# Patient Record
Sex: Male | Born: 1955 | Race: White | Hispanic: No | Marital: Single | Smoking: Never smoker
Health system: Southern US, Community
[De-identification: ages and names within clinical notes are randomized; demographics above are authoritative.]

## PROBLEM LIST (undated history)

## (undated) DIAGNOSIS — M5137 Other intervertebral disc degeneration, lumbosacral region: Secondary | ICD-10-CM

## (undated) DIAGNOSIS — I499 Cardiac arrhythmia, unspecified: Secondary | ICD-10-CM

## (undated) DIAGNOSIS — Q2381 Bicuspid aortic valve: Secondary | ICD-10-CM

## (undated) DIAGNOSIS — Z87442 Personal history of urinary calculi: Secondary | ICD-10-CM

## (undated) DIAGNOSIS — T84022A Instability of internal right knee prosthesis, initial encounter: Secondary | ICD-10-CM

## (undated) DIAGNOSIS — N201 Calculus of ureter: Secondary | ICD-10-CM

## (undated) DIAGNOSIS — G4700 Insomnia, unspecified: Secondary | ICD-10-CM

## (undated) DIAGNOSIS — M51379 Other intervertebral disc degeneration, lumbosacral region without mention of lumbar back pain or lower extremity pain: Secondary | ICD-10-CM

## (undated) DIAGNOSIS — G5791 Unspecified mononeuropathy of right lower limb: Secondary | ICD-10-CM

## (undated) DIAGNOSIS — K219 Gastro-esophageal reflux disease without esophagitis: Secondary | ICD-10-CM

## (undated) DIAGNOSIS — Q231 Congenital insufficiency of aortic valve: Secondary | ICD-10-CM

## (undated) DIAGNOSIS — M1990 Unspecified osteoarthritis, unspecified site: Secondary | ICD-10-CM

## (undated) DIAGNOSIS — I480 Paroxysmal atrial fibrillation: Secondary | ICD-10-CM

## (undated) DIAGNOSIS — Z85828 Personal history of other malignant neoplasm of skin: Secondary | ICD-10-CM

## (undated) DIAGNOSIS — G894 Chronic pain syndrome: Secondary | ICD-10-CM

## (undated) DIAGNOSIS — Z9889 Other specified postprocedural states: Secondary | ICD-10-CM

## (undated) DIAGNOSIS — Z9289 Personal history of other medical treatment: Secondary | ICD-10-CM

## (undated) DIAGNOSIS — D649 Anemia, unspecified: Secondary | ICD-10-CM

## (undated) DIAGNOSIS — R399 Unspecified symptoms and signs involving the genitourinary system: Secondary | ICD-10-CM

## (undated) HISTORY — PX: TOTAL KNEE ARTHROPLASTY: SHX125

## (undated) HISTORY — PX: JOINT REPLACEMENT: SHX530

## (undated) HISTORY — PX: KNEE ARTHROSCOPY: SUR90

## (undated) HISTORY — PX: TONSILLECTOMY: SUR1361

## (undated) HISTORY — PX: OTHER SURGICAL HISTORY: SHX169

## (undated) HISTORY — PX: SHOULDER ARTHROSCOPY: SHX128

## (undated) HISTORY — PX: REVISION TOTAL KNEE ARTHROPLASTY: SUR1280

## (undated) HISTORY — PX: ELBOW ARTHROSCOPY: SUR87

## (undated) HISTORY — PX: NASAL FRACTURE SURGERY: SHX718

---

## 2001-07-20 ENCOUNTER — Encounter: Payer: Self-pay | Admitting: Urology

## 2001-07-20 ENCOUNTER — Encounter: Admission: RE | Admit: 2001-07-20 | Discharge: 2001-07-20 | Payer: Self-pay | Admitting: Urology

## 2002-05-21 ENCOUNTER — Ambulatory Visit (HOSPITAL_BASED_OUTPATIENT_CLINIC_OR_DEPARTMENT_OTHER): Admission: RE | Admit: 2002-05-21 | Discharge: 2002-05-21 | Payer: Self-pay | Admitting: *Deleted

## 2003-01-17 ENCOUNTER — Ambulatory Visit (HOSPITAL_COMMUNITY): Admission: RE | Admit: 2003-01-17 | Discharge: 2003-01-17 | Payer: Self-pay | Admitting: Urology

## 2003-01-17 ENCOUNTER — Encounter: Payer: Self-pay | Admitting: Urology

## 2003-04-22 ENCOUNTER — Ambulatory Visit (HOSPITAL_BASED_OUTPATIENT_CLINIC_OR_DEPARTMENT_OTHER): Admission: RE | Admit: 2003-04-22 | Discharge: 2003-04-22 | Payer: Self-pay | Admitting: Urology

## 2009-02-23 HISTORY — PX: MOHS SURGERY: SUR867

## 2015-11-06 ENCOUNTER — Other Ambulatory Visit (HOSPITAL_COMMUNITY): Payer: Self-pay | Admitting: Orthopedic Surgery

## 2015-11-06 DIAGNOSIS — M25561 Pain in right knee: Secondary | ICD-10-CM

## 2015-11-06 DIAGNOSIS — T849XXA Unspecified complication of internal orthopedic prosthetic device, implant and graft, initial encounter: Secondary | ICD-10-CM

## 2015-11-06 DIAGNOSIS — Z96651 Presence of right artificial knee joint: Secondary | ICD-10-CM

## 2015-11-09 ENCOUNTER — Encounter (HOSPITAL_COMMUNITY)
Admission: RE | Admit: 2015-11-09 | Discharge: 2015-11-09 | Disposition: A | Discharge status: Home / Self Care | Payer: BLUE CROSS/BLUE SHIELD | Source: Ambulatory Visit | Attending: Orthopedic Surgery | Admitting: Orthopedic Surgery

## 2015-11-09 DIAGNOSIS — Y838 Other surgical procedures as the cause of abnormal reaction of the patient, or of later complication, without mention of misadventure at the time of the procedure: Secondary | ICD-10-CM | POA: Diagnosis not present

## 2015-11-09 DIAGNOSIS — M25561 Pain in right knee: Secondary | ICD-10-CM | POA: Diagnosis not present

## 2015-11-09 DIAGNOSIS — T849XXA Unspecified complication of internal orthopedic prosthetic device, implant and graft, initial encounter: Secondary | ICD-10-CM

## 2015-11-09 DIAGNOSIS — Z96651 Presence of right artificial knee joint: Secondary | ICD-10-CM

## 2015-11-09 MED ORDER — TECHNETIUM TC 99M MEDRONATE IV KIT
22.3000 | PACK | Freq: Once | INTRAVENOUS | Status: AC | PRN
  Administered 2015-11-09: 22.3 via INTRAVENOUS

## 2016-01-11 ENCOUNTER — Ambulatory Visit: Payer: Self-pay | Admitting: Orthopedic Surgery

## 2016-01-11 NOTE — H&P (Signed)
Jeremy Webb DOB: 12/24/1955 Single / Language: Jeremy Webb / Race: White Male Date of Admission:  01/24/2016 CC:  Right Knee Pain History of Present Illness The patient is a 60 year old male who comes in for a preoperative History and Physical. The patient is scheduled for a right total knee arthroplasty (revision) to be performed by Jeremy Webb. Aluisio, MD at [facility] on 01-24-2016. The patient is a 61 year old male who presented to the practice today for transition from another physician. The patient reports left knee symptoms including: pain, swelling and instability which began 1 year(s) (1 month) ago without any known injury. The patient describes the severity of the symptoms as moderate in severity. The patient describes their pain as sharp, dull, aching and throbbing.The patient feels that the symptoms are worsening. Prior to being seen today the patient was previously evaluated by a colleague. Previous work-up for this problem has included orthopedic consultation (RT TKA 09/25/2014). and include decreased range of motion, difficulty bearing weight and difficulty ambulating. The patient reports that symptoms radiate to the right thigh (IT band numbness). Onset of symptoms was gradual. Current treatment includes nonsteroidal anti-inflammatory drugs. Jeremy Webb comes in today referred from his PCP Jeremy Webb for second opinion with regards to teh right total knee that was performed last year, October 2015. He was originally diagnose with arthritis of the right knee and recommended to undergo a right total knee arthroplasty. The patient is a very active individual who hunts, fish, hikes, and enjoys the outdoors. He states that he was still doing okay but he just couldn't walk very well leading up to the knee replacement. The surgery was performed by Jeremy Webb at [facility] on 09/22/2014. Patient states that a drain was not used. The surgery went well and he was up walking the  hallway about 60-70 feet that afternoon and the PT had him bending 90 degrees. The knee sweelled a fair amount that night and into day one which at time is was aspirated. On POD 1, they wanted to send him home but he ended up statying until POD 2 due to being nauseated. He was still nauseated the following day, Saturday, but was released with medications. He went home Saturday and had increasing difficulty that night. He became more sick as the night went on and developed fevers of 101 to 102 by that night. His girlfriend called Jeremy Webb service and got in touch with him. He was told to go the next morning, Sunday to [facility] where he was seen by Jeremy Webb and admitted and underwent a second procedure (POD 3 form Right TKA) which was a formal open I&D and Poly exchange. He had a drain placed during this second procedure. Following the I&D, he did get some relief and improved initially for the first couple of months. The first HHPT had him bending 115 degrees and was driving by week three. Unfortunately, the knee got worse and by the third month, he had lost motion back to 90 degrees and had increasing pain and swelling. He ended up going to three differnet therapy departments without improvement. He never had any incisional issues or wound breakdown. At this time, he has diffiuclty picking up his leg. He is not able to do any of the things that he enjoys due to increasing pain and swelling. Its now hurting all the time. It feels like a boil inside the knee. He remembers having some blood work done at some point  at Jeremy Webb office but had not had any further testing, i.e. bone scans. In the past few months, he denies any fevers, chills, night sweatrs but does have constant pain with it. There is no buckling but it feels tight and swollen. The swelling has gone done since the original surgery but still present. He states now that his life has changed for the worse. He enjoys the outdoors, hunting,  fishing, hiking, and bike riding. He is unable to do these things now. 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 He has worked on [employer] for the local [place], [employer] and wants to get back to enjoying the outdoors. He even had to pass up on a safari trip next year with [employer] due to the ongoing problems with his knee. He continues with the significant pain in his right knee. He said every step is very painful for him. Knee also has been giving out on him. His lab work came back normal with normal white count, sed rate, C-reactive protein. He had a bone scan done, which was abnormal with increased uptake throughout. I believe he has a loose prosthesis. Clinically it is acting like that. Fortunately, the blood work was normal, so he has got a very low chance of this being an infection. It is felt that he will need this revised. They have been treated conservatively in the past for the above stated problem and despite conservative measures, they continue to have progressive pain and severe functional limitations and dysfunction. They have failed non-operative management including home exercise, medications, and injections. It is felt that they would benefit from undergoing revision of total joint replacement. Risks and benefits of the procedure have been discussed with the patient and they elect to proceed with surgery. There are no active contraindications to surgery such as ongoing infection or rapidly progressive neurological disease.  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 Tightness in throat  Family History Congestive Heart Failure  father Heart Disease  father and grandfather fathers side Hypertension  mother and father father and brother Osteoarthritis  grandmother fathers side Rheumatoid Arthritis  grandmother fathers side Cancer  grandfather mothers side  Social History Alcohol use  current drinker; drinks wine; only occasionally per week current drinker; drinks beer and wine; only occasionally per week Children  0 Current work status  retired Engineer, agricultural (Currently)  no Drug/Alcohol Rehab (Previously)  no Exercise  Exercises weekly; does individual sport and gym / [employer] Exercises daily; does gym / [employer] Illicit drug use  no Living situation  live alone Marital status  single Number of flights of stairs before winded  less than 1 greater than 5 Pain Contract  no Tobacco / smoke exposure  no Tobacco use  never smoker Previously in rehab  no Advance Directives  Living Will, Healthcare POA [place]  Medication History Hydrocodone-Acetaminophen (5-325MG  Tablet, Oral) Active. Cyclobenzaprine HCl (10MG  Tablet, Oral) Active. ALPRAZolam (0.25MG  Tablet, Oral) Active. CeleBREX (200MG  Capsule, Oral as needed) Active. Protonix (40MG  Tablet  DR, Oral as needed) Active.  Past Surgical History Arthroscopy of Knee  Date: 05/2013. right Colon Polyp Removal - Colonoscopy  Rotator Cuff Repair  bilateral Tonsillectomy  Arthroscopy of Shoulder  bilateral Total Knee Replacement - Right  Date: 10/2014.   Review of System General Not Present- Chills, Fatigue, Fever, Memory Loss, Night Sweats, Weight Gain and Weight Loss. Skin Not Present- Eczema, Hives, Itching, Lesions and Rash. HEENT Not Present- Dentures, Double Vision,  Headache, Hearing Loss, Tinnitus and Visual Loss. Respiratory Not Present- Allergies, Chronic Cough, Coughing up blood, Shortness of breath at rest and Shortness of breath with exertion. Cardiovascular Not Present- Chest Pain, Difficulty Breathing Lying Down, Murmur, Palpitations, Racing/skipping heartbeats and Swelling. Gastrointestinal Not Present- Abdominal Pain, Bloody Stool, Constipation, Diarrhea, Difficulty Swallowing, Heartburn, Jaundice, Loss of appetitie, Nausea and Vomiting. Male Genitourinary Not Present- Blood in Urine, Discharge, Flank Pain, Incontinence, Painful Urination, Urgency, Urinary frequency, Urinary Retention, Urinating at Night and Weak urinary stream. Musculoskeletal Present- Joint Pain. Not Present- Back Pain, Joint Swelling, Morning Stiffness, Muscle Pain, Muscle Weakness and Spasms. Neurological Not Present- Blackout spells, Difficulty with balance, Dizziness, Paralysis, Tremor and Weakness. Psychiatric Not Present- Insomnia.  Vitals  Weight: 250 lb Height: 72in Weight was reported by patient. Height was reported by patient. Body Surface Area: 2.34 m Body Mass Index: 33.91 kg/m  BP: 128/64 (Sitting, Left Arm, Standard)   Physical Exam General Mental Status -Alert, cooperative and good historian. General Appearance-pleasant, Not in acute distress. Orientation-Oriented X3. Build & Nutrition-Well nourished and Well developed.  Head and Neck Head-normocephalic, atraumatic . Neck Global Assessment - supple, no bruit auscultated on the right, no bruit auscultated on the left.  Eye Pupil - Bilateral-Regular and Round. Motion - Bilateral-EOMI.  Chest and Lung Exam Auscultation Breath sounds - clear at anterior chest wall and clear at posterior chest wall. Adventitious sounds - No Adventitious sounds.  Cardiovascular Auscultation Rhythm - Regular rate and rhythm. Heart Sounds - S1 WNL and S2 WNL. Murmurs & Other Heart Sounds -  Auscultation of the heart reveals - No Murmurs.  Abdomen Palpation/Percussion Tenderness - Abdomen is non-tender to palpation. Rigidity (guarding) - Abdomen is soft. Auscultation Auscultation of the abdomen reveals - Bowel sounds normal.  Male Genitourinary Note: Not done, not pertinent to present illness   Musculoskeletal Note: On exam, he is alert and oriented, in no apparent distress. His right knee shows trace effusion. His range of motion is 0 to 110 degrees. He has got fair amount of varus and valgus laxity in extension and significant AP laxity in flexion. He is tender around the tibial aspect of the prosthesis.  RADIOGRAPHS We reviewed a bone scan. He does have increased uptake mainly around the tibial component. There is some slight increase around the femoral component, but it is worse on the tibia.  Assessment & Plan Status post total right knee replacement ([id number]) Complication of internal right knee prosthesis, unspecified complication, subsequent encounter (T84.9XXD, [id number])  Note:Surgical Plans: Right Total Knee Revision  Disposition: Home  PCP: Dr. Raeanne Gathers - Patient has been seen preoperatively and felt to be stable for surgery.  IV TXA  Anesthesia Issues: None  Signed electronically by Joelene Millin, III PA-C

## 2016-01-12 ENCOUNTER — Ambulatory Visit: Payer: Self-pay | Admitting: Orthopedic Surgery

## 2016-01-15 ENCOUNTER — Encounter (HOSPITAL_COMMUNITY): Payer: Self-pay

## 2016-01-15 ENCOUNTER — Encounter (HOSPITAL_COMMUNITY)
Admission: RE | Admit: 2016-01-15 | Discharge: 2016-01-15 | Disposition: A | Discharge status: Home / Self Care | Payer: BLUE CROSS/BLUE SHIELD | Source: Ambulatory Visit | Attending: Orthopedic Surgery | Admitting: Orthopedic Surgery

## 2016-01-15 DIAGNOSIS — Z0183 Encounter for blood typing: Secondary | ICD-10-CM | POA: Insufficient documentation

## 2016-01-15 DIAGNOSIS — Z01812 Encounter for preprocedural laboratory examination: Secondary | ICD-10-CM | POA: Insufficient documentation

## 2016-01-15 DIAGNOSIS — T84092A Other mechanical complication of internal right knee prosthesis, initial encounter: Secondary | ICD-10-CM | POA: Insufficient documentation

## 2016-01-15 DIAGNOSIS — Y838 Other surgical procedures as the cause of abnormal reaction of the patient, or of later complication, without mention of misadventure at the time of the procedure: Secondary | ICD-10-CM | POA: Insufficient documentation

## 2016-01-15 HISTORY — DX: Anemia, unspecified: D64.9

## 2016-01-15 HISTORY — DX: Cardiac arrhythmia, unspecified: I49.9

## 2016-01-15 HISTORY — DX: Gastro-esophageal reflux disease without esophagitis: K21.9

## 2016-01-15 HISTORY — DX: Unspecified osteoarthritis, unspecified site: M19.90

## 2016-01-15 LAB — SURGICAL PCR SCREEN
MRSA, PCR: NEGATIVE
Staphylococcus aureus: NEGATIVE

## 2016-01-15 LAB — COMPREHENSIVE METABOLIC PANEL
ALBUMIN: 4.2 g/dL (ref 3.5–5.0)
ALK PHOS: 99 U/L (ref 38–126)
ALT: 21 U/L (ref 17–63)
AST: 20 U/L (ref 15–41)
Anion gap: 7 (ref 5–15)
BILIRUBIN TOTAL: 0.9 mg/dL (ref 0.3–1.2)
BUN: 12 mg/dL (ref 6–20)
CALCIUM: 9.1 mg/dL (ref 8.9–10.3)
CO2: 25 mmol/L (ref 22–32)
Chloride: 106 mmol/L (ref 101–111)
Creatinine, Ser: 0.98 mg/dL (ref 0.61–1.24)
GFR calc Af Amer: >60 mL/min (ref >60)
GLUCOSE: 103 mg/dL — AB (ref 65–99)
Potassium: 4.1 mmol/L (ref 3.5–5.1)
Sodium: 138 mmol/L (ref 135–145)
TOTAL PROTEIN: 6.9 g/dL (ref 6.5–8.1)

## 2016-01-15 LAB — URINALYSIS, ROUTINE W REFLEX MICROSCOPIC
Bilirubin Urine: NEGATIVE
Glucose, UA: NEGATIVE mg/dL
Hgb urine dipstick: NEGATIVE
Ketones, ur: NEGATIVE mg/dL
LEUKOCYTES UA: NEGATIVE
NITRITE: NEGATIVE
PH: 6 (ref 5.0–8.0)
PROTEIN: NEGATIVE mg/dL
SPECIFIC GRAVITY, URINE: 1.016 (ref 1.005–1.030)

## 2016-01-15 LAB — CBC
HEMATOCRIT: 40 % (ref 39.0–52.0)
HEMOGLOBIN: 13.2 g/dL (ref 13.0–17.0)
MCH: 29 pg (ref 26.0–34.0)
MCHC: 33 g/dL (ref 30.0–36.0)
MCV: 87.9 fL (ref 78.0–100.0)
Platelets: 126 10*3/uL — ABNORMAL LOW (ref 150–400)
RBC: 4.55 MIL/uL (ref 4.22–5.81)
RDW: 13.4 % (ref 11.5–15.5)
WBC: 4.1 10*3/uL (ref 4.0–10.5)

## 2016-01-15 LAB — PROTIME-INR
INR: 0.97 (ref 0.00–1.49)
PROTHROMBIN TIME: 13.1 s (ref 11.6–15.2)

## 2016-01-15 LAB — APTT: APTT: 27 s (ref 24–37)

## 2016-01-15 LAB — ABO/RH: ABO/RH(D): A POS

## 2016-01-15 NOTE — Progress Notes (Signed)
12-06-15 - Surgical clearance and LOV - Dr. Baxter Flattery (pcp) -in chart 12-06-15 - EKG - in chart  11-09-15 - Bone Scan - [facility]  08-15-14 - Echo Report - in chart 08-02-14 - EKG - in chart 08-02-14 - LOV - M. Hulen NP,(cardio) - in chart

## 2016-01-15 NOTE — Patient Instructions (Signed)
Jeremy Webb  01/15/2016   Your procedure is scheduled on: January 24, 2016  Report to [facility] Main  Entrance take [facility]  elevators to 3rd floor to  [place] at 7:45 AM.  Call this number if you have problems the morning of surgery [phone]   Remember: ONLY 1 PERSON MAY GO WITH YOU TO SHORT STAY TO GET  READY MORNING OF [place].  Do not eat food or drink liquids :After Midnight.     Take these medicines the morning of surgery with A SIP OF WATER: Hydrocodone if needed, Protonix DO NOT TAKE ANY DIABETIC MEDICATIONS DAY OF YOUR SURGERY                               You may not have any metal on your body including hair pins and              piercings  Do not wear jewelry, lotions, powders or perfumes, deodorant                       Men may shave face and neck.   Do not bring valuables to the hospital. [place].  Contacts, dentures or bridgework may not be worn into surgery.  Leave suitcase in the car. After surgery it may be brought to your room.       Special Instructions: coughing and deep breathing exercises, leg exercises              Please read over the following fact sheets you were given: _____________________________________________________________________             [facility] - Preparing for Surgery Before surgery, you can play an important role.  Because skin is not sterile, your skin needs to be as free of germs as possible.  You can reduce the number of germs on your skin by washing with CHG (chlorahexidine gluconate) soap before surgery.  CHG is an antiseptic cleaner which kills germs and bonds with the skin to continue killing germs even after washing. Please DO NOT use if you have an allergy to CHG or antibacterial soaps.  If your skin becomes reddened/irritated stop using the CHG and inform your nurse when you arrive at Short Stay. Do not shave (including legs  and underarms) for at least 48 hours prior to the first CHG shower.  You may shave your face/neck. Please follow these instructions carefully:  1.  Shower with CHG Soap the night before surgery and the  morning of Surgery.  2.  If you choose to wash your hair, wash your hair first as usual with your  normal  shampoo.  3.  After you shampoo, rinse your hair and body thoroughly to remove the  shampoo.                           4.  Use CHG as you would any other liquid soap.  You can apply chg directly  to the skin and wash                       Gently with a scrungie or clean washcloth.  5.  Apply the CHG Soap to your body ONLY FROM THE NECK DOWN.   Do not use on face/ open                           Wound or open sores. Avoid contact with eyes, ears mouth and genitals (private parts).                       Wash face,  Genitals (private parts) with your normal soap.             6.  Wash thoroughly, paying special attention to the area where your surgery  will be performed.  7.  Thoroughly rinse your body with warm water from the neck down.  8.  DO NOT shower/wash with your normal soap after using and rinsing off  the CHG Soap.                9.  Pat yourself dry with a clean towel.            10.  Wear clean pajamas.            11.  Place clean sheets on your bed the night of your first shower and do not  sleep with pets. Day of Surgery : Do not apply any lotions/deodorants the morning of surgery.  Please wear clean clothes to the hospital/surgery center.  FAILURE TO FOLLOW THESE INSTRUCTIONS MAY RESULT IN THE CANCELLATION OF YOUR SURGERY PATIENT SIGNATURE_________________________________  NURSE SIGNATURE__________________________________  ________________________________________________________________________  WHAT IS A BLOOD TRANSFUSION? Blood Transfusion Information  A transfusion is the replacement of blood or some of its parts. Blood is made up of multiple cells which provide different  functions.  Red blood cells carry oxygen and are used for blood loss replacement.  White blood cells fight against infection.  Platelets control bleeding.  Plasma helps clot blood.  Other blood products are available for specialized needs, such as hemophilia or other clotting disorders. BEFORE THE TRANSFUSION  Who gives blood for transfusions?   Healthy volunteers who are fully evaluated to make sure their blood is safe. This is blood bank blood. Transfusion therapy is the safest it has ever been in the practice of medicine. Before blood is taken from a donor, a complete history is taken to make sure that person has no history of diseases nor engages in risky social behavior (examples are intravenous drug use or sexual activity with multiple partners). The donor's travel history is screened to minimize risk of transmitting infections, such as malaria. The donated blood is tested for signs of infectious diseases, such as HIV and hepatitis. The blood is then tested to be sure it is compatible with you in order to minimize the chance of a transfusion reaction. If you or a relative donates blood, this is often done in anticipation of surgery and is not appropriate for emergency situations. It takes many days to process the donated blood. RISKS AND COMPLICATIONS Although transfusion therapy is very safe and saves many lives, the main dangers of transfusion include:  1. Getting an infectious disease. 2. Developing a transfusion reaction. This is an allergic reaction to something in the blood you were given. Every precaution is taken to prevent this. The decision to have a blood transfusion has been considered carefully by your caregiver before blood is given. Blood is not given unless the benefits outweigh the risks. AFTER THE TRANSFUSION  Right after  receiving a blood transfusion, you will usually feel much better and more energetic. This is especially true if your red blood cells have gotten low  (anemic). The transfusion raises the level of the red blood cells which carry oxygen, and this usually causes an energy increase.  The nurse administering the transfusion will monitor you carefully for complications. HOME CARE INSTRUCTIONS  No special instructions are needed after a transfusion. You may find your energy is better. Speak with your caregiver about any limitations on activity for underlying diseases you may have. SEEK MEDICAL CARE IF:   Your condition is not improving after your transfusion.  You develop redness or irritation at the intravenous (IV) site. SEEK IMMEDIATE MEDICAL CARE IF:  Any of the following symptoms occur over the next 12 hours:  Shaking chills.  You have a temperature by mouth above 102 F (38.9 C), not controlled by medicine.  Chest, back, or muscle pain.  People around you feel you are not acting correctly or are confused.  Shortness of breath or difficulty breathing.  Dizziness and fainting.  You get a rash or develop hives.  You have a decrease in urine output.  Your urine turns a dark color or changes to pink, red, or brown. Any of the following symptoms occur over the next 10 days:  You have a temperature by mouth above 102 F (38.9 C), not controlled by medicine.  Shortness of breath.  Weakness after normal activity.  The white part of the eye turns yellow (jaundice).  You have a decrease in the amount of urine or are urinating less often.  Your urine turns a dark color or changes to pink, red, or brown. Document Released: 11/15/2000 Document Revised: 02/10/2012 Document Reviewed: 07/04/2008 ExitCare Patient Information 2014 [place].  _______________________________________________________________________  Incentive Spirometer  An incentive spirometer is a tool that can help keep your lungs clear and active. This tool measures how well you are filling your lungs with each breath. Taking long deep breaths may help  reverse or decrease the chance of developing breathing (pulmonary) problems (especially infection) following:  A long period of time when you are unable to move or be active. BEFORE THE PROCEDURE   If the spirometer includes an indicator to show your best effort, your nurse or respiratory therapist will set it to a desired goal.  If possible, sit up straight or lean slightly forward. Try not to slouch.  Hold the incentive spirometer in an upright position. INSTRUCTIONS FOR USE  3. Sit on the edge of your bed if possible, or sit up as far as you can in bed or on a chair. 4. Hold the incentive spirometer in an upright position. 5. Breathe out normally. 6. Place the mouthpiece in your mouth and seal your lips tightly around it. 7. Breathe in slowly and as deeply as possible, raising the piston or the ball toward the top of the column. 8. Hold your breath for 3-5 seconds or for as long as possible. Allow the piston or ball to fall to the bottom of the column. 9. Remove the mouthpiece from your mouth and breathe out normally. 10. Rest for a few seconds and repeat Steps 1 through 7 at least 10 times every 1-2 hours when you are awake. Take your time and take a few normal breaths between deep breaths. 11. The spirometer may include an indicator to show your best effort. Use the indicator as a goal to work toward during each repetition. 12. After each set  of 10 deep breaths, practice coughing to be sure your lungs are clear. If you have an incision (the cut made at the time of surgery), support your incision when coughing by placing a pillow or rolled up towels firmly against it. Once you are able to get out of bed, walk around indoors and cough well. You may stop using the incentive spirometer when instructed by your caregiver.  RISKS AND COMPLICATIONS  Take your time so you do not get dizzy or light-headed.  If you are in pain, you may need to take or ask for pain medication before doing  incentive spirometry. It is harder to take a deep breath if you are having pain. AFTER USE  Rest and breathe slowly and easily.  It can be helpful to keep track of a log of your progress. Your caregiver can provide you with a simple table to help with this. If you are using the spirometer at home, follow these instructions: [place] IF:   You are having difficultly using the spirometer.  You have trouble using the spirometer as often as instructed.  Your pain medication is not giving enough relief while using the spirometer.  You develop fever of 100.5 F (38.1 C) or higher. SEEK IMMEDIATE MEDICAL CARE IF:   You cough up bloody sputum that had not been present before.  You develop fever of 102 F (38.9 C) or greater.  You develop worsening pain at or near the incision site. MAKE SURE YOU:   Understand these instructions.  Will watch your condition.  Will get help right away if you are not doing well or get worse. Document Released: 03/31/2007 Document Revised: 02/10/2012 Document Reviewed: 06/01/2007 [facility] Patient Information 2014 [place], [place].   ________________________________________________________________________

## 2016-01-15 NOTE — Progress Notes (Signed)
01-15-16 - CBC Lab results from preop visit on 01-15-16 faxed to Dr. Wynelle Link via [facility].

## 2016-01-23 MED ORDER — DEXTROSE 5 % IV SOLN
3.0000 g | INTRAVENOUS | Status: AC
  Administered 2016-01-24: 3 g via INTRAVENOUS
  Filled 2016-01-23 (×2): qty 3000

## 2016-01-24 ENCOUNTER — Inpatient Hospital Stay (HOSPITAL_COMMUNITY): Payer: BLUE CROSS/BLUE SHIELD | Admitting: Anesthesiology

## 2016-01-24 ENCOUNTER — Encounter (HOSPITAL_COMMUNITY)
Admission: RE | Disposition: A | Discharge status: Home Health | Payer: Self-pay | Source: Ambulatory Visit | Attending: Orthopedic Surgery

## 2016-01-24 ENCOUNTER — Inpatient Hospital Stay (HOSPITAL_COMMUNITY)
Admission: RE | Admit: 2016-01-24 | Discharge: 2016-01-26 | DRG: 468 | Disposition: A | Discharge status: Home Health | Payer: BLUE CROSS/BLUE SHIELD | Source: Ambulatory Visit | Attending: Orthopedic Surgery | Admitting: Orthopedic Surgery

## 2016-01-24 ENCOUNTER — Encounter (HOSPITAL_COMMUNITY): Payer: Self-pay | Admitting: *Deleted

## 2016-01-24 DIAGNOSIS — G4700 Insomnia, unspecified: Secondary | ICD-10-CM | POA: Diagnosis present

## 2016-01-24 DIAGNOSIS — T84012A Broken internal right knee prosthesis, initial encounter: Secondary | ICD-10-CM

## 2016-01-24 DIAGNOSIS — Z91041 Radiographic dye allergy status: Secondary | ICD-10-CM

## 2016-01-24 DIAGNOSIS — G2581 Restless legs syndrome: Secondary | ICD-10-CM | POA: Diagnosis present

## 2016-01-24 DIAGNOSIS — Z880 Allergy status to penicillin: Secondary | ICD-10-CM

## 2016-01-24 DIAGNOSIS — Y838 Other surgical procedures as the cause of abnormal reaction of the patient, or of later complication, without mention of misadventure at the time of the procedure: Secondary | ICD-10-CM | POA: Diagnosis present

## 2016-01-24 DIAGNOSIS — Z79899 Other long term (current) drug therapy: Secondary | ICD-10-CM

## 2016-01-24 DIAGNOSIS — D649 Anemia, unspecified: Secondary | ICD-10-CM | POA: Diagnosis present

## 2016-01-24 DIAGNOSIS — T84092A Other mechanical complication of internal right knee prosthesis, initial encounter: Secondary | ICD-10-CM | POA: Diagnosis present

## 2016-01-24 DIAGNOSIS — K219 Gastro-esophageal reflux disease without esophagitis: Secondary | ICD-10-CM | POA: Diagnosis present

## 2016-01-24 DIAGNOSIS — Z79891 Long term (current) use of opiate analgesic: Secondary | ICD-10-CM | POA: Diagnosis not present

## 2016-01-24 HISTORY — PX: TOTAL KNEE REVISION: SHX996

## 2016-01-24 LAB — TYPE AND SCREEN
ABO/RH(D): A POS
Antibody Screen: NEGATIVE

## 2016-01-24 SURGERY — TOTAL KNEE REVISION
Anesthesia: Spinal | Laterality: Right

## 2016-01-24 MED ORDER — EPHEDRINE SULFATE 50 MG/ML IJ SOLN
INTRAMUSCULAR | Status: DC | PRN
  Administered 2016-01-24 (×2): 5 mg via INTRAVENOUS

## 2016-01-24 MED ORDER — LACTATED RINGERS IV SOLN
INTRAVENOUS | Status: DC
  Administered 2016-01-24: 15:00:00 via INTRAVENOUS

## 2016-01-24 MED ORDER — ALPRAZOLAM 0.25 MG PO TABS
0.2500 mg | ORAL_TABLET | Freq: Every evening | ORAL | Status: DC | PRN
  Administered 2016-01-24: 0.25 mg via ORAL
  Filled 2016-01-24: qty 1

## 2016-01-24 MED ORDER — ONDANSETRON HCL 4 MG/2ML IJ SOLN
INTRAMUSCULAR | Status: DC | PRN
  Administered 2016-01-24: 4 mg via INTRAVENOUS

## 2016-01-24 MED ORDER — BUPIVACAINE LIPOSOME 1.3 % IJ SUSP
INTRAMUSCULAR | Status: DC | PRN
  Administered 2016-01-24: 20 mL

## 2016-01-24 MED ORDER — EPHEDRINE SULFATE 50 MG/ML IJ SOLN
INTRAMUSCULAR | Status: AC
  Filled 2016-01-24: qty 1

## 2016-01-24 MED ORDER — BUPIVACAINE HCL 0.25 % IJ SOLN
INTRAMUSCULAR | Status: DC | PRN
  Administered 2016-01-24: 20 mL

## 2016-01-24 MED ORDER — METHOCARBAMOL 500 MG PO TABS
500.0000 mg | ORAL_TABLET | Freq: Four times a day (QID) | ORAL | Status: DC | PRN
  Administered 2016-01-24 – 2016-01-25 (×3): 500 mg via ORAL
  Filled 2016-01-24 (×3): qty 1

## 2016-01-24 MED ORDER — BUPIVACAINE HCL (PF) 0.25 % IJ SOLN
INTRAMUSCULAR | Status: AC
  Filled 2016-01-24: qty 30

## 2016-01-24 MED ORDER — CHLORHEXIDINE GLUCONATE 4 % EX LIQD: 60.0000 mL | Freq: Once | CUTANEOUS | Status: DC

## 2016-01-24 MED ORDER — DEXAMETHASONE SODIUM PHOSPHATE 10 MG/ML IJ SOLN
10.0000 mg | Freq: Once | INTRAMUSCULAR | Status: AC
  Administered 2016-01-25: 10 mg via INTRAVENOUS
  Filled 2016-01-24: qty 1

## 2016-01-24 MED ORDER — SODIUM CHLORIDE 0.9 % IV SOLN
INTRAVENOUS | Status: DC
  Administered 2016-01-24: 17:00:00 via INTRAVENOUS

## 2016-01-24 MED ORDER — ACETAMINOPHEN 500 MG PO TABS
1000.0000 mg | ORAL_TABLET | Freq: Four times a day (QID) | ORAL | Status: AC
  Administered 2016-01-24 – 2016-01-25 (×3): 1000 mg via ORAL
  Filled 2016-01-24 (×5): qty 2

## 2016-01-24 MED ORDER — ONDANSETRON HCL 4 MG/2ML IJ SOLN
INTRAMUSCULAR | Status: AC
  Filled 2016-01-24: qty 2

## 2016-01-24 MED ORDER — OXYCODONE HCL 5 MG PO TABS
5.0000 mg | ORAL_TABLET | ORAL | Status: DC | PRN
  Administered 2016-01-24: 5 mg via ORAL
  Administered 2016-01-24: 10 mg via ORAL
  Administered 2016-01-24: 5 mg via ORAL
  Administered 2016-01-25: 10 mg via ORAL
  Filled 2016-01-24 (×2): qty 2
  Filled 2016-01-24 (×2): qty 1

## 2016-01-24 MED ORDER — PROPOFOL 10 MG/ML IV BOLUS
INTRAVENOUS | Status: AC
  Filled 2016-01-24: qty 20

## 2016-01-24 MED ORDER — ACETAMINOPHEN 160 MG/5ML PO SOLN: 975.0000 mg | Freq: Once | ORAL | Status: DC

## 2016-01-24 MED ORDER — POLYETHYLENE GLYCOL 3350 17 G PO PACK: 17.0000 g | PACK | Freq: Every day | ORAL | Status: DC | PRN

## 2016-01-24 MED ORDER — PROPOFOL 500 MG/50ML IV EMUL
INTRAVENOUS | Status: DC | PRN
  Administered 2016-01-24: 140 ug/kg/min via INTRAVENOUS

## 2016-01-24 MED ORDER — DIPHENHYDRAMINE HCL 12.5 MG/5ML PO ELIX: 12.5000 mg | ORAL_SOLUTION | ORAL | Status: DC | PRN

## 2016-01-24 MED ORDER — HYDROMORPHONE HCL 1 MG/ML IJ SOLN: 0.2500 mg | INTRAMUSCULAR | Status: DC | PRN

## 2016-01-24 MED ORDER — CEFAZOLIN SODIUM-DEXTROSE 2-3 GM-% IV SOLR
2.0000 g | Freq: Four times a day (QID) | INTRAVENOUS | Status: AC
  Administered 2016-01-24 (×2): 2 g via INTRAVENOUS
  Filled 2016-01-24 (×2): qty 50

## 2016-01-24 MED ORDER — DEXAMETHASONE SODIUM PHOSPHATE 10 MG/ML IJ SOLN
INTRAMUSCULAR | Status: AC
  Filled 2016-01-24: qty 1

## 2016-01-24 MED ORDER — METOCLOPRAMIDE HCL 10 MG PO TABS: 5.0000 mg | ORAL_TABLET | Freq: Three times a day (TID) | ORAL | Status: DC | PRN

## 2016-01-24 MED ORDER — ACETAMINOPHEN 10 MG/ML IV SOLN
INTRAVENOUS | Status: AC
  Filled 2016-01-24: qty 100

## 2016-01-24 MED ORDER — ONDANSETRON HCL 4 MG/2ML IJ SOLN: 4.0000 mg | Freq: Four times a day (QID) | INTRAMUSCULAR | Status: DC | PRN

## 2016-01-24 MED ORDER — METOCLOPRAMIDE HCL 5 MG/ML IJ SOLN: 5.0000 mg | Freq: Three times a day (TID) | INTRAMUSCULAR | Status: DC | PRN

## 2016-01-24 MED ORDER — DOCUSATE SODIUM 100 MG PO CAPS
100.0000 mg | ORAL_CAPSULE | Freq: Two times a day (BID) | ORAL | Status: DC
  Administered 2016-01-24 – 2016-01-26 (×4): 100 mg via ORAL

## 2016-01-24 MED ORDER — PANTOPRAZOLE SODIUM 40 MG PO TBEC: 40.0000 mg | DELAYED_RELEASE_TABLET | Freq: Every day | ORAL | Status: DC | PRN

## 2016-01-24 MED ORDER — FENTANYL CITRATE (PF) 100 MCG/2ML IJ SOLN
INTRAMUSCULAR | Status: AC
  Filled 2016-01-24: qty 2

## 2016-01-24 MED ORDER — FLEET ENEMA 7-19 GM/118ML RE ENEM: 1.0000 | ENEMA | Freq: Once | RECTAL | Status: DC | PRN

## 2016-01-24 MED ORDER — SODIUM CHLORIDE 0.9 % IV SOLN: INTRAVENOUS | Status: DC

## 2016-01-24 MED ORDER — ACETAMINOPHEN 325 MG PO TABS: 650.0000 mg | ORAL_TABLET | Freq: Four times a day (QID) | ORAL | Status: DC | PRN

## 2016-01-24 MED ORDER — SODIUM CHLORIDE 0.9 % IJ SOLN
INTRAMUSCULAR | Status: DC | PRN
  Administered 2016-01-24: 30 mL

## 2016-01-24 MED ORDER — KETOROLAC TROMETHAMINE 15 MG/ML IJ SOLN
7.5000 mg | Freq: Four times a day (QID) | INTRAMUSCULAR | Status: AC | PRN
  Administered 2016-01-24: 7.5 mg via INTRAVENOUS
  Filled 2016-01-24: qty 1

## 2016-01-24 MED ORDER — PROPOFOL 10 MG/ML IV BOLUS
INTRAVENOUS | Status: AC
  Filled 2016-01-24: qty 60

## 2016-01-24 MED ORDER — ONDANSETRON HCL 4 MG PO TABS: 4.0000 mg | ORAL_TABLET | Freq: Four times a day (QID) | ORAL | Status: DC | PRN

## 2016-01-24 MED ORDER — SODIUM CHLORIDE 0.9 % IJ SOLN
INTRAMUSCULAR | Status: AC
  Filled 2016-01-24: qty 10

## 2016-01-24 MED ORDER — PHENOL 1.4 % MT LIQD: 1.0000 | OROMUCOSAL | Status: DC | PRN

## 2016-01-24 MED ORDER — FENTANYL CITRATE (PF) 100 MCG/2ML IJ SOLN
INTRAMUSCULAR | Status: DC | PRN
  Administered 2016-01-24: 100 ug via INTRAVENOUS

## 2016-01-24 MED ORDER — BUPIVACAINE LIPOSOME 1.3 % IJ SUSP
20.0000 mL | Freq: Once | INTRAMUSCULAR | Status: DC
  Filled 2016-01-24: qty 20

## 2016-01-24 MED ORDER — MENTHOL 3 MG MT LOZG: 1.0000 | LOZENGE | OROMUCOSAL | Status: DC | PRN

## 2016-01-24 MED ORDER — ACETAMINOPHEN 650 MG RE SUPP: 650.0000 mg | Freq: Four times a day (QID) | RECTAL | Status: DC | PRN

## 2016-01-24 MED ORDER — DEXAMETHASONE SODIUM PHOSPHATE 10 MG/ML IJ SOLN
10.0000 mg | Freq: Once | INTRAMUSCULAR | Status: AC
  Administered 2016-01-24: 10 mg via INTRAVENOUS

## 2016-01-24 MED ORDER — TRAMADOL HCL 50 MG PO TABS
50.0000 mg | ORAL_TABLET | Freq: Four times a day (QID) | ORAL | Status: DC | PRN
  Administered 2016-01-24: 100 mg via ORAL
  Filled 2016-01-24: qty 2

## 2016-01-24 MED ORDER — MORPHINE SULFATE (PF) 2 MG/ML IV SOLN
1.0000 mg | INTRAVENOUS | Status: DC | PRN
  Administered 2016-01-24: 1 mg via INTRAVENOUS
  Filled 2016-01-24: qty 1

## 2016-01-24 MED ORDER — LACTATED RINGERS IV SOLN
INTRAVENOUS | Status: DC
  Administered 2016-01-24: 1000 mL via INTRAVENOUS
  Administered 2016-01-24: 10:00:00 via INTRAVENOUS

## 2016-01-24 MED ORDER — BUPIVACAINE IN DEXTROSE 0.75-8.25 % IT SOLN
INTRATHECAL | Status: DC | PRN
  Administered 2016-01-24 (×2): 2 mL via INTRATHECAL

## 2016-01-24 MED ORDER — ACETAMINOPHEN 10 MG/ML IV SOLN
1000.0000 mg | Freq: Once | INTRAVENOUS | Status: AC
  Administered 2016-01-24: 1000 mg via INTRAVENOUS
  Filled 2016-01-24: qty 100

## 2016-01-24 MED ORDER — RIVAROXABAN 10 MG PO TABS
10.0000 mg | ORAL_TABLET | Freq: Every day | ORAL | Status: DC
  Administered 2016-01-25 – 2016-01-26 (×2): 10 mg via ORAL
  Filled 2016-01-24 (×3): qty 1

## 2016-01-24 MED ORDER — TRANEXAMIC ACID 1000 MG/10ML IV SOLN
1000.0000 mg | INTRAVENOUS | Status: AC
  Administered 2016-01-24: 1000 mg via INTRAVENOUS
  Filled 2016-01-24: qty 10

## 2016-01-24 MED ORDER — METHOCARBAMOL 1000 MG/10ML IJ SOLN
500.0000 mg | Freq: Four times a day (QID) | INTRAVENOUS | Status: DC | PRN
  Administered 2016-01-24: 500 mg via INTRAVENOUS
  Filled 2016-01-24 (×2): qty 5

## 2016-01-24 MED ORDER — SODIUM CHLORIDE 0.9 % IJ SOLN
INTRAMUSCULAR | Status: AC
  Filled 2016-01-24: qty 50

## 2016-01-24 MED ORDER — MIDAZOLAM HCL 5 MG/5ML IJ SOLN
INTRAMUSCULAR | Status: DC | PRN
  Administered 2016-01-24: 2 mg via INTRAVENOUS

## 2016-01-24 MED ORDER — BISACODYL 10 MG RE SUPP: 10.0000 mg | Freq: Every day | RECTAL | Status: DC | PRN

## 2016-01-24 MED ORDER — MIDAZOLAM HCL 2 MG/2ML IJ SOLN
INTRAMUSCULAR | Status: AC
  Filled 2016-01-24: qty 2

## 2016-01-24 SURGICAL SUPPLY — 68 items
ADAPTER BOLT FEMORAL +2/-2 (Knees) ×1 IMPLANT
ADPR FEM +2/-2 OFST BOLT (Knees) ×1 IMPLANT
ADPR FEM 5D STRL KN PFC SGM (Orthopedic Implant) ×1 IMPLANT
AUG FEM SZ5 4 CMB POST STRL LF (Orthopedic Implant) ×2 IMPLANT
AUG FEM SZ5 4 STRL LF KN RT TI (Knees) ×2 IMPLANT
AUG TIB SZ5 5 REV STP WDG STRL (Knees) ×2 IMPLANT
AUGMENT DIST PFC SIGMA 4MM RT (Knees) IMPLANT
AUGMENT FMRL POST PFC 4MM SZ5 (Orthopedic Implant) IMPLANT
BAG DECANTER FOR FLEXI CONT (MISCELLANEOUS) ×2 IMPLANT
BAG SPEC THK2 15X12 ZIP CLS (MISCELLANEOUS)
BAG ZIPLOCK 12X15 (MISCELLANEOUS) IMPLANT
BANDAGE ACE 6X5 VEL STRL LF (GAUZE/BANDAGES/DRESSINGS) ×3 IMPLANT
BLADE SAG 18X100X1.27 (BLADE) ×2 IMPLANT
BLADE SAW SGTL 11.0X1.19X90.0M (BLADE) ×2 IMPLANT
BONE CEMENT GENTAMICIN (Cement) ×6 IMPLANT
CEMENT BONE GENTAMICIN 40 (Cement) ×3 IMPLANT
CLOTH BEACON ORANGE TIMEOUT ST (SAFETY) ×2 IMPLANT
CUFF TOURN SGL QUICK 34 (TOURNIQUET CUFF) ×2
CUFF TRNQT CYL 34X4X40X1 (TOURNIQUET CUFF) ×1 IMPLANT
DIS AUG PFC SIGMA 4MM RIGHT (Knees) ×4 IMPLANT
DRAPE U-SHAPE 47X51 STRL (DRAPES) ×2 IMPLANT
DRSG ADAPTIC 3X8 NADH LF (GAUZE/BANDAGES/DRESSINGS) ×2 IMPLANT
DRSG PAD ABDOMINAL 8X10 ST (GAUZE/BANDAGES/DRESSINGS) ×2 IMPLANT
DURAPREP 26ML APPLICATOR (WOUND CARE) ×2 IMPLANT
ELECT REM PT RETURN 9FT ADLT (ELECTROSURGICAL) ×2
ELECTRODE REM PT RTRN 9FT ADLT (ELECTROSURGICAL) ×1 IMPLANT
EVACUATOR 1/8 PVC DRAIN (DRAIN) ×2 IMPLANT
FEM POST AUG PFC 4MM SZ5 (Orthopedic Implant) ×4 IMPLANT
FEM TC3 PFC SIGMA SZ5 (Orthopedic Implant) ×2 IMPLANT
FEMORAL ADAPTER (Orthopedic Implant) ×1 IMPLANT
FEMORAL TC3 PFC SIGMA SZ5 (Orthopedic Implant) IMPLANT
GAUZE SPONGE 4X4 12PLY STRL (GAUZE/BANDAGES/DRESSINGS) ×2 IMPLANT
GLOVE BIO SURGEON STRL SZ7.5 (GLOVE) IMPLANT
GLOVE BIO SURGEON STRL SZ8 (GLOVE) ×2 IMPLANT
GLOVE BIOGEL PI IND STRL 8 (GLOVE) ×1 IMPLANT
GLOVE BIOGEL PI INDICATOR 8 (GLOVE) ×1
GLOVE SURG SS PI 6.5 STRL IVOR (GLOVE) IMPLANT
GOWN STRL REUS W/TWL LRG LVL3 (GOWN DISPOSABLE) ×2 IMPLANT
GOWN STRL REUS W/TWL XL LVL3 (GOWN DISPOSABLE) IMPLANT
HANDPIECE INTERPULSE COAX TIP (DISPOSABLE) ×2
IMMOBILIZER KNEE 20 (SOFTGOODS) ×2
IMMOBILIZER KNEE 20 THIGH 36 (SOFTGOODS) ×1 IMPLANT
INSERT SIGMA RP TC3 5 17.5 (Knees) ×1 IMPLANT
MANIFOLD NEPTUNE II (INSTRUMENTS) ×2 IMPLANT
NS IRRIG 1000ML POUR BTL (IV SOLUTION) ×2 IMPLANT
PACK TOTAL KNEE CUSTOM (KITS) ×2 IMPLANT
PADDING CAST COTTON 6X4 STRL (CAST SUPPLIES) ×4 IMPLANT
POSITIONER SURGICAL ARM (MISCELLANEOUS) ×2 IMPLANT
RESTRICTOR CEMENT SZ 5 C-STEM (Cement) ×1 IMPLANT
SET HNDPC FAN SPRY TIP SCT (DISPOSABLE) ×1 IMPLANT
STEM FLUTED UNIV REV 75X20 (Stem) ×1 IMPLANT
STEM TIBIA PFC 13X30MM (Stem) ×1 IMPLANT
STRIP CLOSURE SKIN 1/2X4 (GAUZE/BANDAGES/DRESSINGS) ×1 IMPLANT
SUT VIC AB 2-0 CT1 27 (SUTURE) ×6
SUT VIC AB 2-0 CT1 TAPERPNT 27 (SUTURE) ×3 IMPLANT
SUT VLOC 180 0 24IN GS25 (SUTURE) ×2 IMPLANT
SWAB COLLECTION DEVICE MRSA (MISCELLANEOUS) IMPLANT
SWAB CULTURE ESWAB REG 1ML (MISCELLANEOUS) IMPLANT
SYR 50ML LL SCALE MARK (SYRINGE) ×4 IMPLANT
TOWER CARTRIDGE SMART MIX (DISPOSABLE) ×2 IMPLANT
TRAY FOLEY W/METER SILVER 14FR (SET/KITS/TRAYS/PACK) ×2 IMPLANT
TRAY FOLEY W/METER SILVER 16FR (SET/KITS/TRAYS/PACK) ×2 IMPLANT
TRAY SLEEVE CEM ML (Knees) ×1 IMPLANT
TRAY TIB SZ 5 REVISION (Knees) ×1 IMPLANT
TUBE KAMVAC SUCTION (TUBING) IMPLANT
WATER STERILE IRR 1500ML POUR (IV SOLUTION) ×2 IMPLANT
WEDGE SZ 5 5MM (Knees) ×2 IMPLANT
WRAP KNEE MAXI GEL POST OP (GAUZE/BANDAGES/DRESSINGS) ×1 IMPLANT

## 2016-01-24 NOTE — Op Note (Signed)
NAMEROD, VENTURINO NO.:  [id number]  MEDICAL RECORD NO.:  [id number]  LOCATION:  [facility]                         FACILITY:  [facility]  PHYSICIAN:  Gaynelle Arabian, M.D.    DATE OF BIRTH:  12/20/1955  DATE OF PROCEDURE:  01/24/2016 DATE OF DISCHARGE:                              OPERATIVE REPORT   PREOPERATIVE DIAGNOSIS:  Failed right total knee arthroplasty.  POSTOPERATIVE DIAGNOSIS:  Failed right total knee arthroplasty.  PROCEDURE:  Right total knee arthroplasty revision.  SURGEON:  Gaynelle Arabian, M.D.  ASSISTANT:  Ardeen Jourdain, PA-C.  ANESTHESIA:  Spinal.  ESTIMATED BLOOD LOSS:  Minimal.  DRAINS:  Hemovac x1.  COMPLICATIONS:  None.  CONDITION:  Stable to recovery.  BRIEF CLINICAL NOTE:  Mr. Freire is a 60 year old male, had a right total knee arthroplasty done approximately year and half to 2 years ago. He has had persistent pain and instability-type symptoms since.  I saw him in second opinion few months ago.  All of his blood work was normal and did not show signs of infection.  Attempted aspiration showed no fluid.  Bone scan did show loosening of his tibial component.  He presents now for resection arthroplasty versus revision total knee arthroplasty.  PROCEDURE IN DETAIL:  After successful administration of spinal anesthetic, a tourniquet was placed high on the right thigh and right lower extremity was prepped and draped in the usual sterile fashion. Extremity was wrapped in Esmarch, tourniquet inflated to 300 mmHg. Midline incision was made with a 10-blade through subcutaneous tissue to the extensor mechanism.  A fresh blade was used make a medial parapatellar arthrotomy.  There was small amount of clear fluid in the joint, which was sent for stat Gram stain, which is negative.  Soft tissue over the proximal medial tibia subperiosteally elevated to the joint line with a knife and into the semimembranosus bursa with a Cobb elevator.  Soft  tissue laterally was elevated with attention being paid to avoid the patellar tendon on tibial tubercle.  Patella was not everting or even subluxing easily, so I did a quadriceps snip and I could evert the patella at that point.  I was then able to flex the knee 90 degrees.  I removed the tibial polyethylene.  We then subluxed the tibia forward and placed retractors to get circumferential exposure.  I cleared the soft tissue from the front of the tibial component, then with an oscillating saw, gently disrupted the interface just anteriorly between the tibial component and bone, and then tibial component was easily removed consistent with loosening of the tibial component.  The extramedullary tibial alignment guide was placed referencing proximally to medial aspect of the tibial tubercle and distally along the second metatarsal axis and tibial crest.  Block was pinned to remove about 4 mm off the tibial surface.  This was to get underneath the cement.  Resection was made with an oscillating saw.  The cement was then removed from the proximal canal.  I then thoroughly irrigated the canal and reamed up to 13 mm for placement of 13-mm stem.  We then sized and size 5 was the most appropriate tibial component.  The proximal tibia was prepared  with the modular drill and then modular drill plus stem with size 5.  I then prepared proximally for a 29-mm sleeve to control rotation.  We then disrupted the interface between the femoral component and bone and these were removed the femoral component with minimal bone loss. Used a drill again into the femoral canal and then thoroughly irrigated the canal with saline.  I reamed up to 20 mm, which had an excellent press-fit.  I left a 20-mm reamer and placed to serve as an intramedullary cutting guide.  Distal femoral cutting block was placed and I removed about 2 mm of each side.  In order to get a level, I had to go into the +4 space and thus would  have to about 4-mm distal augments medial and lateral.  We removed the distal femoral cutting block and placed AP block.  Size 5 was most appropriate.  Rotation was marked off the epicondylar axis and confirmed with a spacer block and flexion to greater rectangular flexion space.  It was placed in a +2 position, effectively raising the stem and lowering the component to the anterior cortex of the femur.  Anterior, posterior and chamfer cuts were made.  We barely took bone on any of these cuts.  I felt that the complement was too riding high, so I was going to go with 4-mm posterior augments, medial and lateral.  Intercondylar block was then placed and intercondylar cut placed with TC3.  Trials were placed.  On tibial side, trialed the size 5 MBT revision tray with a 13 x 30 stem extension, 5-mm augments medial and lateral and a 29-mm sleeve.  I placed that component with excellent fit.  On the femoral side, size 5 TC3 femur with 4-mm distal augments, medial and lateral, 4-mm posterior augments, medial and lateral and 20 x 75 stem extension in the +2 position and 5 degrees of valgus.  With these, I got up to a 17.5-mm trial, which allowed for full extension with excellent varus-valgus and anterior-posterior balance throughout full range of motion.  The patella was identified and patelloplasty was performed to remove the soft tissue from the surfaces of the patella.  The component was well fixed and it was left intact as I was able to get the track normally.  We then released the tourniquet after being of 43 minutes. It was held down for 8 minutes while the components were assembled on the back table.  Minor bleeding was stopped with the cautery while the tourniquet was down.  The components were assembled on the back table and after 8 minutes, we rewrapped the leg in Esmarch and reinflated the tourniquet to 300 mmHg.  Trial components were removed.  We then trialed the cement restrictor,  which was size 5.  Size 5 cement restrictor was placed to the appropriate depth in the tibial canal.  The cut bone surfaces were then prepared with pulsatile lavage.  Cement was mixed and once ready for implantation, the tibial component was cemented into place.  The femoral component was cemented distally with a press-fit stem.  A 17.5 trial insert was placed, knee held in full extension and all extruded cement removed.  Once the cement fully hardened, then, the permanent 17.5-mm TC3 rotating platform insert was placed into the tibial tray.  Full extension was achieved with excellent varus-valgus and anterior-posterior balance throughout full range of motion.  Wound was again thoroughly irrigated.  We then placed a Hemovac drain and I first closed the quadriceps  snip with an interrupted #1 PDS suture. Total of 20 mL of Exparel with 30 mL of saline were injected into the extensor mechanism, the periosteum of the femur and subcu tissues. Additional 20 mL of 0.25% Marcaine was injected into the same tissues. The arthrotomy was then closed with a running #1 V-Loc suture.  Flexion against gravity was about 120 degrees.  The patella tracks normally. The tourniquet was then released for a second tourniquet time of 21 minutes.  Subcu was closed with interrupted 2-0 Vicryl, subcuticular running 4-0 Monocryl.  Drains were hooked to suction.  Incision was cleaned and dried, and Steri-Strips and a bulky sterile dressing applied.  He was then placed into a knee immobilizer, awakened and transported to recovery in stable condition.  Note that a surgical assistant was a medical necessity for this procedure to do in a safe and expeditious manner.  Assistance necessary for retraction of vital ligaments and neurovascular structures and proper positioning of the limb to help remove the old prosthesis and for accurate and proper placement of the new prosthesis.     Gaynelle Arabian, M.D.     FA/MEDQ   D:  01/24/2016  T:  01/24/2016  Job:  [id number]

## 2016-01-24 NOTE — H&P (View-Only) (Signed)
Jeremy Webb DOB: 19-Jun-1956 Single / Language: Jeremy Webb / Race: White Male Date of Admission:  01/24/2016 CC:  Right Knee Pain History of Present Illness The patient is a 60 year old male who comes in for a preoperative History and Physical. The patient is scheduled for a right total knee arthroplasty (revision) to be performed by Dr. Dione Webb. Aluisio, MD at [facility] on 01-24-2016. The patient is a 60 year old male who presented to the practice today for transition from another physician. The patient reports left knee symptoms including: pain, swelling and instability which began 1 year(s) (1 month) ago without any known injury. The patient describes the severity of the symptoms as moderate in severity. The patient describes their pain as sharp, dull, aching and throbbing.The patient feels that the symptoms are worsening. Prior to being seen today the patient was previously evaluated by a colleague. Previous work-up for this problem has included orthopedic consultation (RT TKA 09/25/2014). and include decreased range of motion, difficulty bearing weight and difficulty ambulating. The patient reports that symptoms radiate to the right thigh (IT band numbness). Onset of symptoms was gradual. Current treatment includes nonsteroidal anti-inflammatory drugs. Mr. Jeremy Webb comes in today referred from his PCP Dr. Wilder Webb for second opinion with regards to teh right total knee that was performed last year, October 2015. He was originally diagnose with arthritis of the right knee and recommended to undergo a right total knee arthroplasty. The patient is a very active individual who hunts, fish, hikes, and enjoys the outdoors. He states that he was still doing okay but he just couldn't walk very well leading up to the knee replacement. The surgery was performed by Dr. Leonides Webb at [facility] on 09/22/2014. Patient states that a drain was not used. The surgery went well and he was up walking the  hallway about 60-70 feet that afternoon and the PT had him bending 90 degrees. The knee sweelled a fair amount that night and into day one which at time is was aspirated. On POD 1, they wanted to send him home but he ended up statying until POD 2 due to being nauseated. He was still nauseated the following day, Saturday, but was released with medications. He went home Saturday and had increasing difficulty that night. He became more sick as the night went on and developed fevers of 101 to 102 by that night. His girlfriend called Dr. Guido Webb service and got in touch with him. He was told to go the next morning, Sunday to [facility] where he was seen by Dr. Leonides Webb and admitted and underwent a second procedure (POD 3 form Right TKA) which was a formal open I&D and Poly exchange. He had a drain placed during this second procedure. Following the I&D, he did get some relief and improved initially for the first couple of months. The first HHPT had him bending 115 degrees and was driving by week three. Unfortunately, the knee got worse and by the third month, he had lost motion back to 90 degrees and had increasing pain and swelling. He ended up going to three differnet therapy departments without improvement. He never had any incisional issues or wound breakdown. At this time, he has diffiuclty picking up his leg. He is not able to do any of the things that he enjoys due to increasing pain and swelling. Its now hurting all the time. It feels like a boil inside the knee. He remembers having some blood work done at some point  at Dr. Tera Webb office but had not had any further testing, i.e. bone scans. In the past few months, he denies any fevers, chills, night sweatrs but does have constant pain with it. There is no buckling but it feels tight and swollen. The swelling has gone done since the original surgery but still present. He states now that his life has changed for the worse. He enjoys the outdoors, hunting,  fishing, hiking, and bike riding. He is unable to do these things now. 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 He has worked on [employer] for the local [employer] and wants to get back to enjoying the outdoors. He even had to pass up on a safari trip next year with [employer] due to the ongoing problems with his knee. He continues with the significant pain in his right knee. He said every step is very painful for him. Knee also has been giving out on him. His lab work came back normal with normal white count, sed rate, C-reactive protein. He had a bone scan done, which was abnormal with increased uptake throughout. I believe he has a loose prosthesis. Clinically it is acting like that. Fortunately, the blood work was normal, so he has got a very low chance of this being an infection. It is felt that he will need this revised. They have been treated conservatively in the past for the above stated problem and despite conservative measures, they continue to have progressive pain and severe functional limitations and dysfunction. They have failed non-operative management including home exercise, medications, and injections. It is felt that they would benefit from undergoing revision of total joint replacement. Risks and benefits of the procedure have been discussed with the patient and they elect to proceed with surgery. There are no active contraindications to surgery such as ongoing infection or rapidly progressive neurological disease.  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 Tightness in throat  Family History Congestive Heart Failure  father Heart Disease  father and grandfather fathers side Hypertension  mother and father father and brother Osteoarthritis  grandmother fathers side Rheumatoid Arthritis  grandmother fathers side Cancer  grandfather mothers side  Social History Alcohol use  current drinker; drinks wine; only occasionally per week current drinker; drinks beer and wine; only occasionally per week Children  0 Current work status  retired Engineer, agricultural (Currently)  no Drug/Alcohol Rehab (Previously)  no Exercise  Exercises weekly; does individual sport and gym / [employer] Exercises daily; does gym / [employer] Illicit drug use  no Living situation  live alone Marital status  single Number of flights of stairs before winded  less than 1 greater than 5 Pain Contract  no Tobacco / smoke exposure  no Tobacco use  never smoker Previously in rehab  no Advance Directives  Living Will, Healthcare POA [place]  Medication History Hydrocodone-Acetaminophen (5-325MG  Tablet, Oral) Active. Cyclobenzaprine HCl (10MG  Tablet, Oral) Active. ALPRAZolam (0.25MG  Tablet, Oral) Active. CeleBREX (200MG  Capsule, Oral as needed) Active. Protonix (40MG  Tablet  DR, Oral as needed) Active.  Past Surgical History Arthroscopy of Knee  Date: 05/2013. right Colon Polyp Removal - Colonoscopy  Rotator Cuff Repair  bilateral Tonsillectomy  Arthroscopy of Shoulder  bilateral Total Knee Replacement - Right  Date: 10/2014.   Review of System General Not Present- Chills, Fatigue, Fever, Memory Loss, Night Sweats, Weight Gain and Weight Loss. Skin Not Present- Eczema, Hives, Itching, Lesions and Rash. HEENT Not Present- Dentures, Double Vision,  Headache, Hearing Loss, Tinnitus and Visual Loss. Respiratory Not Present- Allergies, Chronic Cough, Coughing up blood, Shortness of breath at rest and Shortness of breath with exertion. Cardiovascular Not Present- Chest Pain, Difficulty Breathing Lying Down, Murmur, Palpitations, Racing/skipping heartbeats and Swelling. Gastrointestinal Not Present- Abdominal Pain, Bloody Stool, Constipation, Diarrhea, Difficulty Swallowing, Heartburn, Jaundice, Loss of appetitie, Nausea and Vomiting. Male Genitourinary Not Present- Blood in Urine, Discharge, Flank Pain, Incontinence, Painful Urination, Urgency, Urinary frequency, Urinary Retention, Urinating at Night and Weak urinary stream. Musculoskeletal Present- Joint Pain. Not Present- Back Pain, Joint Swelling, Morning Stiffness, Muscle Pain, Muscle Weakness and Spasms. Neurological Not Present- Blackout spells, Difficulty with balance, Dizziness, Paralysis, Tremor and Weakness. Psychiatric Not Present- Insomnia.  Vitals  Weight: 250 lb Height: 72in Weight was reported by patient. Height was reported by patient. Body Surface Area: 2.34 m Body Mass Index: 33.91 kg/m  BP: 128/64 (Sitting, Left Arm, Standard)   Physical Exam General Mental Status -Alert, cooperative and good historian. General Appearance-pleasant, Not in acute distress. Orientation-Oriented X3. Build & Nutrition-Well nourished and Well developed.  Head and Neck Head-normocephalic, atraumatic . Neck Global Assessment - supple, no bruit auscultated on the right, no bruit auscultated on the left.  Eye Pupil - Bilateral-Regular and Round. Motion - Bilateral-EOMI.  Chest and Lung Exam Auscultation Breath sounds - clear at anterior chest wall and clear at posterior chest wall. Adventitious sounds - No Adventitious sounds.  Cardiovascular Auscultation Rhythm - Regular rate and rhythm. Heart Sounds - S1 WNL and S2 WNL. Murmurs & Other Heart Sounds -  Auscultation of the heart reveals - No Murmurs.  Abdomen Palpation/Percussion Tenderness - Abdomen is non-tender to palpation. Rigidity (guarding) - Abdomen is soft. Auscultation Auscultation of the abdomen reveals - Bowel sounds normal.  Male Genitourinary Note: Not done, not pertinent to present illness   Musculoskeletal Note: On exam, he is alert and oriented, in no apparent distress. His right knee shows trace effusion. His range of motion is 0 to 110 degrees. He has got fair amount of varus and valgus laxity in extension and significant AP laxity in flexion. He is tender around the tibial aspect of the prosthesis.  RADIOGRAPHS We reviewed a bone scan. He does have increased uptake mainly around the tibial component. There is some slight increase around the femoral component, but it is worse on the tibia.  Assessment & Plan Status post total right knee replacement ([id number]) Complication of internal right knee prosthesis, unspecified complication, subsequent encounter (T84.9XXD, [id number])  Note:Surgical Plans: Right Total Knee Revision  Disposition: Home  PCP: Dr. Raeanne Gathers - Patient has been seen preoperatively and felt to be stable for surgery.  IV TXA  Anesthesia Issues: None  Signed electronically by Joelene Millin, III PA-C

## 2016-01-24 NOTE — Brief Op Note (Signed)
01/24/2016  11:42 AM  PATIENT:  Jeremy Webb  60 y.o. male  PRE-OPERATIVE DIAGNOSIS:  FAILED RIGHT TOTAL KNEE ARTHROPLASTY  POST-OPERATIVE DIAGNOSIS:  FAILED RIGHT TOTAL KNEE ARTHROPLASTY  PROCEDURE:  Procedure(s): RIGHT TOTAL KNEE REVISION  (Right)  SURGEON:  Surgeon(s) and Role:    * Gaynelle Arabian, MD - Primary  PHYSICIAN ASSISTANT:   ASSISTANTS: Ardeen Jourdain, PA-C, Arlee Muslim, PA-C   ANESTHESIA:   spinal  EBL:  Total I/O In: 1000 [I.V.:1000] Out: 225 [Urine:175; Blood:50]  BLOOD ADMINISTERED:none  DRAINS: (Medium) Hemovact drain(s) in the right knee with  Suction Open   LOCAL MEDICATIONS USED:  OTHER Exparel  COUNTS:  YES  TOURNIQUET:   Total Tourniquet Time Documented: Thigh (Right) - 43 minutes Thigh (Right) - 21 minutes Total: Thigh (Right) - 64 minutes   DICTATION: .Other Dictation: Dictation Number [id number]  PLAN OF CARE: Admit to inpatient   PATIENT DISPOSITION:  PACU - hemodynamically stable.

## 2016-01-24 NOTE — Anesthesia Postprocedure Evaluation (Signed)
Anesthesia Post Note  Patient: Jeremy Webb  Procedure(s) Performed: Procedure(s) (LRB): RIGHT TOTAL KNEE REVISION  (Right)  Patient location during evaluation: PACU Anesthesia Type: Spinal Level of consciousness: awake Pain management: satisfactory to patient Vital Signs Assessment: post-procedure vital signs reviewed and stable Respiratory status: spontaneous breathing Cardiovascular status: blood pressure returned to baseline Postop Assessment: no headache and spinal receding Anesthetic complications: no    Last Vitals:  Filed Vitals:   01/24/16 1500 01/24/16 1518  BP: 129/76 110/62  Pulse: 62 61  Temp: 36.3 C 36.5 C  Resp: 14 14    Last Pain:  Filed Vitals:   01/24/16 1519  PainSc: 0-No pain                 Onika Gudiel EDWARD

## 2016-01-24 NOTE — Anesthesia Preprocedure Evaluation (Addendum)

## 2016-01-24 NOTE — Interval H&P Note (Signed)
History and Physical Interval Note:  01/24/2016 8:52 AM  Jeremy Webb  has presented today for surgery, with the diagnosis of FAILED RIGHT TOTAL KNEE ARTHROPLASTY  The various methods of treatment have been discussed with the patient and family. After consideration of risks, benefits and other options for treatment, the patient has consented to  Procedure(s): RIGHT TOTAL KNEE REVISION VERSES A RESECTION ARTHROPLaSTY (Right) as a surgical intervention .  The patient's history has been reviewed, patient examined, no change in status, stable for surgery.  I have reviewed the patient's chart and labs.  Questions were answered to the patient's satisfaction.     Gearlean Alf

## 2016-01-24 NOTE — Anesthesia Procedure Notes (Addendum)
Spinal Patient location during procedure: OR Start time: 01/24/2016 10:03 AM End time: 01/24/2016 10:10 AM Staffing Anesthesiologist: Lyndle Herrlich Preanesthetic Checklist Completed: patient identified, site marked, surgical consent, pre-op evaluation, timeout performed, IV checked, risks and benefits discussed and monitors and equipment checked Spinal Block Patient position: sitting Prep: DuraPrep Patient monitoring: heart rate, cardiac monitor, continuous pulse ox and blood pressure Approach: midline Location: L3-4 Injection technique: single-shot Needle Needle type: Sprotte  Needle gauge: 24 G Needle length: 9 cm Assessment Sensory level: T4 Additional Notes Repeat SAB; No block with #1 SAB   Spinal Patient location during procedure: OR Start time: 01/24/2016 9:54 AM End time: 01/24/2016 9:59 AM Staffing Resident/CRNA: Paisley Grajeda L Performed by: resident/CRNA  Preanesthetic Checklist Completed: patient identified, site marked, surgical consent, pre-op evaluation, timeout performed, IV checked, risks and benefits discussed and monitors and equipment checked Spinal Block Patient position: sitting Prep: DuraPrep Patient monitoring: heart rate, continuous pulse ox and blood pressure Approach: midline Location: L3-4 Injection technique: single-shot Needle Needle type: Sprotte  Needle gauge: 24 G Needle length: 10 cm Additional Notes Kit Expiration date 03/31/2017 and lot number checked CSF clear, negative heme, negative paresthesia Tolerated well, lowered to right lateral position then supine Block did not set up as it should.  Repeat block by Dr. Seward Speck

## 2016-01-24 NOTE — Transfer of Care (Signed)
Immediate Anesthesia Transfer of Care Note  Patient: Jeremy Webb  Procedure(s) Performed: Procedure(s): RIGHT TOTAL KNEE REVISION  (Right)  Patient Location: PACU  Anesthesia Type:Spinal  Level of Consciousness: awake and alert   Airway & Oxygen Therapy: Patient Spontanous Breathing and Patient connected to face mask oxygen  Post-op Assessment: Report given to RN and Post -op Vital signs reviewed and stable  Post vital signs: Reviewed and stable  Last Vitals:  Filed Vitals:   01/24/16 0735  BP: 128/73  Pulse: 80  Temp: 36.4 C  Resp: 20    Complications: No apparent anesthesia complications

## 2016-01-25 ENCOUNTER — Encounter (HOSPITAL_COMMUNITY): Payer: Self-pay | Admitting: Orthopedic Surgery

## 2016-01-25 LAB — BASIC METABOLIC PANEL
Anion gap: 9 (ref 5–15)
BUN: 12 mg/dL (ref 6–20)
CHLORIDE: 104 mmol/L (ref 101–111)
CO2: 24 mmol/L (ref 22–32)
CREATININE: 1.01 mg/dL (ref 0.61–1.24)
Calcium: 8.9 mg/dL (ref 8.9–10.3)
GFR calc Af Amer: >60 mL/min (ref >60)
GFR calc non Af Amer: >60 mL/min (ref >60)
GLUCOSE: 144 mg/dL — AB (ref 65–99)
POTASSIUM: 4.2 mmol/L (ref 3.5–5.1)
SODIUM: 137 mmol/L (ref 135–145)

## 2016-01-25 LAB — CBC
HCT: 36.9 % — ABNORMAL LOW (ref 39.0–52.0)
HEMOGLOBIN: 12.3 g/dL — AB (ref 13.0–17.0)
MCH: 29.6 pg (ref 26.0–34.0)
MCHC: 33.3 g/dL (ref 30.0–36.0)
MCV: 88.9 fL (ref 78.0–100.0)
Platelets: 158 10*3/uL (ref 150–400)
RBC: 4.15 MIL/uL — AB (ref 4.22–5.81)
RDW: 13.5 % (ref 11.5–15.5)
WBC: 12.5 10*3/uL — ABNORMAL HIGH (ref 4.0–10.5)

## 2016-01-25 MED ORDER — SODIUM CHLORIDE 0.9 % IV BOLUS (SEPSIS)
250.0000 mL | Freq: Once | INTRAVENOUS | Status: AC
Start: 2016-01-25 — End: 2016-01-25
  Administered 2016-01-25: 250 mL via INTRAVENOUS

## 2016-01-25 MED ORDER — HYDROMORPHONE HCL 2 MG PO TABS
2.0000 mg | ORAL_TABLET | ORAL | Status: DC | PRN
  Administered 2016-01-25 – 2016-01-26 (×10): 4 mg via ORAL
  Filled 2016-01-25 (×10): qty 2

## 2016-01-25 MED ORDER — HYDROMORPHONE HCL 1 MG/ML IJ SOLN
1.0000 mg | INTRAMUSCULAR | Status: DC | PRN
  Administered 2016-01-25 (×2): 1 mg via INTRAVENOUS
  Filled 2016-01-25: qty 1

## 2016-01-25 MED ORDER — HYDROMORPHONE HCL 1 MG/ML IJ SOLN
INTRAMUSCULAR | Status: AC
  Filled 2016-01-25: qty 1

## 2016-01-25 NOTE — Progress Notes (Addendum)
   Subjective: 1 Day Post-Op Procedure(s) (LRB): RIGHT TOTAL KNEE REVISION  (Right) Patient reports pain as mild.  Not much sleep last night. Patient seen in rounds with Dr. Wynelle Link.  With regards to his knee, he is doing fantastic.  He is already doing SLR's and bending about 110 degrees actively. Reminded him to not overdo it too much. Patient is well, but has had some minor complaints of pain in the knee, requiring pain medications We will start therapy today. Did not get good control with the Oxy yesterday so will try the Dilaudid pills this morning. Plan is to go Home after hospital stay.  Objective: Vital signs in last 24 hours: Temp:  [97.1 F (36.2 C)-98.2 F (36.8 C)] 98.1 F (36.7 C) (02/23 0547) Pulse Rate:  [52-88] 88 (02/23 0547) Resp:  [11-20] 17 (02/23 0547) BP: (110-136)/(62-97) 118/69 mmHg (02/23 0547) SpO2:  [95 %-100 %] 98 % (02/23 0547)  Intake/Output from previous day:  Intake/Output Summary (Last 24 hours) at 01/25/16 0754 Last data filed at 01/25/16 0547  Gross per 24 hour  Intake   5087 ml  Output   5380 ml  Net   -293 ml    Intake/Output this shift: UOP 1500 since around [place] Negative on fluids.  Will give extra IV fluids this morning.  Labs:  Recent Labs  01/25/16 0410  HGB 12.3*    Recent Labs  01/25/16 0410  WBC 12.5*  RBC 4.15*  HCT 36.9*  PLT 158    Recent Labs  01/25/16 0410  NA 137  K 4.2  CL 104  CO2 24  BUN 12  CREATININE 1.01  GLUCOSE 144*  CALCIUM 8.9   No results for input(s): LABPT, INR in the last 72 hours.  EXAM General - Patient is Alert, Appropriate and Oriented Extremity - Neurovascular intact Sensation intact distally Dorsiflexion/Plantar flexion intact Dressing - dressing C/D/I Motor Function - intact, moving foot and toes well on exam. Actively bending knee about 110 degrees this morning. Doing SLR's without difficulty.   Past Medical History  Diagnosis Date  . Dysrhythmia     atrial fib  . GERD  (gastroesophageal reflux disease)   . Arthritis   . Anemia     hx of    Assessment/Plan: 1 Day Post-Op Procedure(s) (LRB): RIGHT TOTAL KNEE REVISION  (Right) Principal Problem:   Failed total right knee replacement (HCC) Active Problems:   Failed total knee, right (HCC)  Estimated body mass index is 36.34 kg/(m^2) as calculated from the following:   Height as of this encounter: 6' (1.829 m).   Weight as of this encounter: 121.564 kg (268 lb). Advance diet Up with therapy Plan for discharge tomorrow Discharge home with home health  DVT Prophylaxis - Xarelto Weight-Bearing as tolerated to right leg D/C O2 and Pulse OX and try on Room Air HGB 12.3 postop day one. Culture - negative so far to date  Arlee Muslim, PA-C Orthopaedic Surgery 01/25/2016, 7:54 AM

## 2016-01-25 NOTE — Progress Notes (Signed)
Physical Therapy Treatment Patient Details Name: Jeremy Webb MRN: [id number] DOB: May 05, 1956 Today's Date: 01/25/2016    History of Present Illness R TKR revision    PT Comments    Pt progressing well with mobility and hopeful for dc home tomorrow.  Follow Up Recommendations  Home health PT     Equipment Recommendations  None recommended by PT    Recommendations for Other Services OT consult     Precautions / Restrictions Precautions Precautions: Knee;Fall Restrictions Weight Bearing Restrictions: No RLE Weight Bearing: Weight bearing as tolerated    Mobility  Bed Mobility Overal bed mobility: Modified Independent Bed Mobility: Supine to Sit     Supine to sit: Min guard     General bed mobility comments: cues for sequence and use of L LE to self assist  Transfers Overall transfer level: Needs assistance Equipment used: Rolling walker (2 wheeled) Transfers: Sit to/from Stand Sit to Stand: Min guard         General transfer comment: cues for LE management and use of UEs to self assist  Ambulation/Gait Ambulation/Gait assistance: Min guard;Supervision Ambulation Distance (Feet): 160 Feet Assistive device: Rolling walker (2 wheeled) Gait Pattern/deviations: Step-to pattern;Step-through pattern;Shuffle;Trunk flexed Gait velocity: decr   General Gait Details: min cues for sequence, posture and position from [employer]            Wheelchair Mobility    Modified Rankin (Stroke Patients Only)       Balance                                    Cognition Arousal/Alertness: Awake/alert Behavior During Therapy: WFL for tasks assessed/performed Overall Cognitive Status: Within Functional Limits for tasks assessed                      Exercises Total Joint Exercises Ankle Circles/Pumps: AROM;Both;15 reps;Supine Quad Sets: AROM;Both;10 reps;Supine Heel Slides: AAROM;15 reps;Right;Supine Straight Leg Raises:  AAROM;AROM;Right;15 reps;Supine    General Comments        Pertinent Vitals/Pain Pain Assessment: 0-10 Pain Score: 4  Pain Location: R knee Pain Descriptors / Indicators: Aching;Sore Pain Intervention(s): Limited activity within patient's tolerance;Monitored during session    [name] expects to be discharged to:: Private residence Living Arrangements: Alone Available Help at Discharge: Friend(s);Available 24 hours/day Type of Home: House Home Access: Stairs to enter Entrance Stairs-Rails: Right Home Layout: One level Home Equipment: Walker - 2 wheels;Crutches;Bedside commode      Prior Function Level of Independence: Independent with assistive device(s)      Comments: cane vs crutches    PT Goals (current goals can now be found in the care plan section) Acute Rehab PT Goals Patient Stated Goal: go home tomorrow PT Goal Formulation: With patient Time For Goal Achievement: 01/29/16 Potential to Achieve Goals: Good Progress towards PT goals: Progressing toward goals    Frequency  7X/week    PT Plan Current plan remains appropriate    Co-evaluation             End of Session Equipment Utilized During Treatment: Gait belt Activity Tolerance: Patient tolerated treatment well Patient left: in bed;with call bell/phone within reach;with family/visitor present     Time: [id number] PT Time Calculation (min) (ACUTE ONLY): 29 min  Charges:  $Gait Training: 8-22 mins $Therapeutic Exercise: 8-22 mins  G Codes:      Jeremy Webb 2016-02-19, 4:27 PM

## 2016-01-25 NOTE — Progress Notes (Signed)
Utilization review completed.  

## 2016-01-25 NOTE — Care Management Note (Signed)
Case Management Note  Patient Details  Name: Jeremy Webb MRN: [id number] Date of Birth: March 19, 1956  Subjective/Objective:                  RIGHT TOTAL KNEE REVISION (Right) Action/Plan: Discharge planning Expected Discharge Date:  01/26/16               Expected Discharge Plan:  [place]  In-House Referral:     Discharge planning Services  CM Consult  Post Acute Care Choice:    Choice offered to:  Patient  DME Arranged:  N/A DME Agency:  NA  HH Arranged:  PT HH Agency:  [place]  Status of Service:     Medicare Important Message Given:    Date Medicare IM Given:    Medicare IM give by:    Date Additional Medicare IM Given:    Additional Medicare Important Message give by:     If discussed at [place] of Stay Meetings, dates discussed:    Additional Comments: CM met with pt in room to offer choice of home health agency.  Pt chooses [facility] to render HHPT.  Referral given to [facility] rep, tim (on unit).  Pt has DME at home.  No other CM needs were communicated. Dellie Catholic, RN 01/25/2016, 1:17 PM

## 2016-01-25 NOTE — Progress Notes (Signed)
Occupational Therapy Evaluation Patient Details Name: Jeremy Webb MRN: [id number] DOB: January 14, 1956 Today's Date: 01/25/2016    History of Present Illness R TKR revision   Clinical Impression   Patient presents to OT with decreased ADL independence as expected post-op. ADL education initiated; will complete next session. OT will follow.    Follow Up Recommendations  No OT follow up;Supervision - Intermittent    Equipment Recommendations  None recommended by OT    Recommendations for Other Services PT consult     Precautions / Restrictions Precautions Precautions: Knee;Fall Restrictions Weight Bearing Restrictions: No RLE Weight Bearing: Weight bearing as tolerated      Mobility Bed Mobility                  Transfers                      Balance                                            ADL Overall ADL's : Needs assistance/impaired Eating/Feeding: Independent   Grooming: Set up   Upper Body Bathing: Set up;Sitting   Lower Body Bathing: Minimal assistance;Sit to/from stand   Upper Body Dressing : Set up;Sitting   Lower Body Dressing: Minimal assistance;Sit to/from stand                 General ADL Comments: Educated patient on LB bathing/dressing techniques; toilet transfer. Will practice/educate on tub transfer technique tomorrow. OT will follow.     Vision     Perception     Praxis      Pertinent Vitals/Pain Pain Assessment: 0-10 Pain Score: 4  Pain Location: R knee Pain Descriptors / Indicators: Aching;Sore Pain Intervention(s): Limited activity within patient's tolerance;Monitored during session;Ice applied     Hand Dominance Right   Extremity/Trunk Assessment Upper Extremity Assessment Upper Extremity Assessment: Overall WFL for tasks assessed   Lower Extremity Assessment Lower Extremity Assessment: Defer to PT evaluation       Communication Communication Communication: No  difficulties   Cognition Arousal/Alertness: Awake/alert Behavior During Therapy: WFL for tasks assessed/performed Overall Cognitive Status: Within Functional Limits for tasks assessed                     General Comments       Exercises       Shoulder Instructions      Home Living Family/patient expects to be discharged to:: Private residence Living Arrangements: Alone Available Help at Discharge: Friend(s);Available 24 hours/day Type of Home: House Home Access: Stairs to enter [employer] of Steps: 3   Home Layout: One level     Bathroom Shower/Tub: Teacher, [employer] years/pre: Standard Bathroom Accessibility: Yes How Accessible: Accessible via walker Home Equipment: Walker - 2 wheels;Crutches;Bedside commode          Prior Functioning/Environment Level of Independence: Independent with assistive device(s)        Comments: cane vs crutches     OT Diagnosis: Acute pain   OT Problem List: Decreased strength;Decreased range of motion;Decreased knowledge of use of DME or AE;Pain   OT Treatment/Interventions: Self-care/ADL training;DME and/or AE instruction;Therapeutic activities;Patient/family education    OT Goals(Current goals can be found in the care plan section) Acute Rehab OT Goals Patient Stated Goal: go home tomorrow OT Goal Formulation: With patient  Time For Goal Achievement: 02/08/16 Potential to Achieve Goals: Good  OT Frequency: Min 2X/week   Barriers to D/C:            Co-evaluation              End of Session   Activity Tolerance: Patient tolerated treatment well Patient left: in bed;with call bell/phone within reach;with family/visitor present   Time: [id number] OT Time Calculation (min): 19 min Charges:  OT General Charges $OT Visit: 1 Procedure OT Evaluation $OT Eval Low Complexity: 1 Procedure G-Codes:    Jeremy Webb A 2016-02-01, 12:07 PM

## 2016-01-25 NOTE — Discharge Instructions (Addendum)
° °Dr. Frank Aluisio °Total Joint Specialist °[facility] °[address] °[place], [place] [address] °[phone] ° °TOTAL KNEE REPLACEMENT POSTOPERATIVE DIRECTIONS ° °Knee Rehabilitation, Guidelines Following Surgery  °Results after knee surgery are often greatly improved when you follow the exercise, range of motion and muscle strengthening exercises prescribed by your doctor. Safety measures are also important to protect the knee from further injury. Any time any of these exercises cause you to have increased pain or swelling in your knee joint, decrease the amount until you are comfortable again and slowly increase them. If you have problems or questions, call your caregiver or physical therapist for advice.  ° °HOME CARE INSTRUCTIONS  °Remove items at home which could result in a fall. This includes throw rugs or furniture in walking pathways.  °· ICE to the affected knee every three hours for 30 minutes at a time and then as needed for pain and swelling.  Continue to use ice on the knee for pain and swelling from surgery. You may notice swelling that will progress down to the foot and ankle.  This is normal after surgery.  Elevate the leg when you are not up walking on it.   °· Continue to use the breathing machine which will help keep your temperature down.  It is common for your temperature to cycle up and down following surgery, especially at night when you are not up moving around and exerting yourself.  The breathing machine keeps your lungs expanded and your temperature down. °· Do not place pillow under knee, focus on keeping the knee straight while resting ° °DIET °You may resume your previous home diet once your are discharged from the hospital. ° °DRESSING / WOUND CARE / SHOWERING °You may shower 3 days after surgery, but keep the wounds dry during showering.  You may use an occlusive plastic wrap (Press'n Seal for example), NO SOAKING/SUBMERGING IN THE BATHTUB.  If the  bandage gets wet, change with a clean dry gauze.  If the incision gets wet, pat the wound dry with a clean towel. °You may start showering once you are discharged home but do not submerge the incision under water. Just pat the incision dry and apply a dry gauze dressing on daily. °Change the surgical dressing daily and reapply a dry dressing each time. ° °ACTIVITY °Walk with your walker as instructed. °Use walker as long as suggested by your caregivers. °Avoid periods of inactivity such as sitting longer than an hour when not asleep. This helps prevent blood clots.  °You may resume a sexual relationship in one month or when given the OK by your doctor.  °You may return to work once you are cleared by your doctor.  °Do not drive a car for 6 weeks or until released by you surgeon.  °Do not drive while taking narcotics. ° °WEIGHT BEARING °Weight bearing as tolerated with assist device (walker, cane, etc) as directed, use it as long as suggested by your surgeon or therapist, typically at least 4-6 weeks. ° °POSTOPERATIVE CONSTIPATION PROTOCOL °Constipation - defined medically as fewer than three stools per week and severe constipation as less than one stool per week. ° °One of the most common issues patients have following surgery is constipation.  Even if you have a regular bowel pattern at home, your normal regimen is likely to be disrupted due to multiple reasons following surgery.  Combination of anesthesia, postoperative narcotics, change in appetite and fluid intake all can affect your bowels.    In order to avoid complications following surgery, here are some recommendations in order to help you during your recovery period. ° °Colace (docusate) - Pick up an over-the-counter form of Colace or another stool softener and take twice a day as long as you are requiring postoperative pain medications.  Take with a full glass of water daily.  If you experience loose stools or diarrhea, hold the colace until you stool forms  back up.  If your symptoms do not get better within 1 week or if they get worse, check with your doctor. ° °Dulcolax (bisacodyl) - Pick up over-the-counter and take as directed by the product packaging as needed to assist with the movement of your bowels.  Take with a full glass of water.  Use this product as needed if not relieved by Colace only.  ° °MiraLax (polyethylene glycol) - Pick up over-the-counter to have on hand.  MiraLax is a solution that will increase the amount of water in your bowels to assist with bowel movements.  Take as directed and can mix with a glass of water, juice, soda, coffee, or tea.  Take if you go more than two days without a movement. °Do not use MiraLax more than once per day. Call your doctor if you are still constipated or irregular after using this medication for 7 days in a row. ° °If you continue to have problems with postoperative constipation, please contact the office for further assistance and recommendations.  If you experience "the worst abdominal pain ever" or develop nausea or vomiting, please contact the office immediatly for further recommendations for treatment. ° °ITCHING ° If you experience itching with your medications, try taking only a single pain pill, or even half a pain pill at a time.  You can also use Benadryl over the counter for itching or also to help with sleep.  ° °TED HOSE STOCKINGS °Wear the elastic stockings on both legs for three weeks following surgery during the day but you may remove then at night for sleeping. ° °MEDICATIONS °See your medication summary on the “After Visit Summary” that the nursing staff will review with you prior to discharge.  You may have some home medications which will be placed on hold until you complete the course of blood thinner medication.  It is important for you to complete the blood thinner medication as prescribed by your surgeon.  Continue your approved medications as instructed at time of  discharge. ° °PRECAUTIONS °If you experience chest pain or shortness of breath - call 911 immediately for transfer to the hospital emergency department.  °If you develop a fever greater that 101 F, purulent drainage from wound, increased redness or drainage from wound, foul odor from the wound/dressing, or calf pain - CONTACT YOUR SURGEON.   °                                                °FOLLOW-UP APPOINTMENTS °Make sure you keep all of your appointments after your operation with your surgeon and caregivers. You should call the office at the above phone number and make an appointment for approximately two weeks after the date of your surgery or on the date instructed by your surgeon outlined in the "After Visit Summary". ° ° °RANGE OF MOTION AND STRENGTHENING EXERCISES  °Rehabilitation of the knee is important following a knee injury or   an operation. After just a few days of immobilization, the muscles of the thigh which control the knee become weakened and shrink (atrophy). Knee exercises are designed to build up the tone and strength of the thigh muscles and to improve knee motion. Often times heat used for twenty to thirty minutes before working out will loosen up your tissues and help with improving the range of motion but do not use heat for the first two weeks following surgery. These exercises can be done on a training (exercise) mat, on the floor, on a table or on a bed. Use what ever works the best and is most comfortable for you Knee exercises include:  °Leg Lifts - While your knee is still immobilized in a splint or cast, you can do straight leg raises. Lift the leg to 60 degrees, hold for 3 sec, and slowly lower the leg. Repeat 10-20 times 2-3 times daily. Perform this exercise against resistance later as your knee gets better.  °Quad and Hamstring Sets - Tighten up the muscle on the front of the thigh (Quad) and hold for 5-10 sec. Repeat this 10-20 times hourly. Hamstring sets are done by pushing the  foot backward against an object and holding for 5-10 sec. Repeat as with quad sets.  °· Leg Slides: Lying on your back, slowly slide your foot toward your buttocks, bending your knee up off the floor (only go as far as is comfortable). Then slowly slide your foot back down until your leg is flat on the floor again. °· Angel Wings: Lying on your back spread your legs to the side as far apart as you can without causing discomfort.  °A rehabilitation program following serious knee injuries can speed recovery and prevent re-injury in the future due to weakened muscles. Contact your doctor or a physical therapist for more information on knee rehabilitation.  ° °IF YOU ARE TRANSFERRED TO A SKILLED REHAB FACILITY °If the patient is transferred to a skilled rehab facility following release from the hospital, a list of the current medications will be sent to the facility for the patient to continue.  When discharged from the skilled rehab facility, please have the facility set up the patient's Home Health Physical Therapy prior to being released. Also, the skilled facility will be responsible for providing the patient with their medications at time of release from the facility to include their pain medication, the muscle relaxants, and their blood thinner medication. If the patient is still at the rehab facility at time of the two week follow up appointment, the skilled rehab facility will also need to assist the patient in arranging follow up appointment in our office and any transportation needs. ° °MAKE SURE YOU:  °Understand these instructions.  °Get help right away if you are not doing well or get worse.  ° ° °Pick up stool softner and laxative for home use following surgery while on pain medications. °Do not submerge incision under water. °Please use good hand washing techniques while changing dressing each day. °May shower starting three days after surgery. °Please use a clean towel to pat the incision dry following  showers. °Continue to use ice for pain and swelling after surgery. °Do not use any lotions or creams on the incision until instructed by your surgeon. ° °Take Xarelto for two and a half more weeks, then discontinue Xarelto. °Once the patient has completed the blood thinner regimen, then take a Baby 81 mg Aspirin daily for three more weeks. ° ° °Information   on my medicine - XARELTO (Rivaroxaban)  This medication education was reviewed with me or my healthcare representative as part of my discharge preparation.  The pharmacist that spoke with me during my hospital stay was:  Minda Ditto, [facility]  Why was Xarelto prescribed for you? Xarelto was prescribed for you to reduce the risk of blood clots forming after orthopedic surgery. The medical term for these abnormal blood clots is venous thromboembolism (VTE).  What do you need to know about xarelto ? Take your Xarelto ONCE DAILY at the same time every day. You may take it either with or without food.  If you have difficulty swallowing the tablet whole, you may crush it and mix in applesauce just prior to taking your dose.  Take Xarelto exactly as prescribed by your doctor and DO NOT stop taking Xarelto without talking to the doctor who prescribed the medication.  Stopping without other VTE prevention medication to take the place of Xarelto may increase your risk of developing a clot.  After discharge, you should have regular check-up appointments with your healthcare provider that is prescribing your Xarelto.    What do you do if you miss a dose? If you miss a dose, take it as soon as you remember on the same day then continue your regularly scheduled once daily regimen the next day. Do not take two doses of Xarelto on the same day.   Important Safety Information A possible side effect of Xarelto is bleeding. You should call your healthcare provider right away if you experience any of the following: ? Bleeding from an injury or your nose  that does not stop. ? Unusual colored urine (red or dark brown) or unusual colored stools (red or black). ? Unusual bruising for unknown reasons. ? A serious fall or if you hit your head (even if there is no bleeding).  Some medicines may interact with Xarelto and might increase your risk of bleeding while on Xarelto. To help avoid this, consult your healthcare provider or pharmacist prior to using any new prescription or non-prescription medications, including herbals, vitamins, non-steroidal anti-inflammatory drugs (NSAIDs) and supplements.  Hold Celebrex while on Xarelto  This website has more information on Xarelto: [email or web address]

## 2016-01-25 NOTE — Evaluation (Signed)
Physical Therapy Evaluation Patient Details Name: Jeremy Webb MRN: [id number] DOB: 09-08-1956 Today's Date: 01/25/2016   History of Present Illness  R TKR revision  Clinical Impression  Pt s/p R TKR revision presents with decreased R LE strength/ROM and post op pain limiting functional mobility.  Pt should progress well to dc home with family assist and HHPT follow up.    Follow Up Recommendations Home health PT    Equipment Recommendations  None recommended by PT    Recommendations for Other Services OT consult     Precautions / Restrictions Precautions Precautions: Knee;Fall Restrictions Weight Bearing Restrictions: No RLE Weight Bearing: Weight bearing as tolerated      Mobility  Bed Mobility Overal bed mobility: Needs Assistance Bed Mobility: Supine to Sit     Supine to sit: Min guard     General bed mobility comments: cues for sequence and use of L LE to self assist  Transfers Overall transfer level: Needs assistance Equipment used: Rolling walker (2 wheeled) Transfers: Sit to/from Stand Sit to Stand: Min guard         General transfer comment: cues for LE management and use of UEs to self assist  Ambulation/Gait Ambulation/Gait assistance: Min guard Ambulation Distance (Feet): 90 Feet Assistive device: Rolling walker (2 wheeled) Gait Pattern/deviations: Step-to pattern;Shuffle;Trunk flexed Gait velocity: decr   General Gait Details: cues for sequence, posture and position from [employer]            Wheelchair Mobility    Modified Rankin (Stroke Patients Only)       Balance                                             Pertinent Vitals/Pain Pain Assessment: 0-10 Pain Score: 4  Pain Location: R knee Pain Descriptors / Indicators: Aching;Sore Pain Intervention(s): Limited activity within patient's tolerance;Monitored during session;Premedicated before session;Ice applied    Home Living Family/patient expects  to be discharged to:: Private residence Living Arrangements: Alone Available Help at Discharge: Friend(s);Available 24 hours/day Type of Home: House Home Access: Stairs to enter Entrance Stairs-Rails: Right Entrance Stairs-Number of Steps: 3 Home Layout: One level Home Equipment: Walker - 2 wheels;Crutches;Bedside commode      Prior Function Level of Independence: Independent with assistive device(s)         Comments: cane vs crutches      Hand Dominance   Dominant Hand: Right    Extremity/Trunk Assessment   Upper Extremity Assessment: Overall WFL for tasks assessed           Lower Extremity Assessment: RLE deficits/detail RLE Deficits / Details: 3/5 quads with AAROM at knee -10 - 100    Cervical / Trunk Assessment: Normal  Communication   Communication: No difficulties  Cognition Arousal/Alertness: Awake/alert Behavior During Therapy: WFL for tasks assessed/performed Overall Cognitive Status: Within Functional Limits for tasks assessed                      General Comments      Exercises Total Joint Exercises Ankle Circles/Pumps: AROM;Both;15 reps;Supine Quad Sets: AROM;Both;10 reps;Supine Heel Slides: AAROM;15 reps;Right;Supine Straight Leg Raises: AAROM;AROM;Right;15 reps;Supine      Assessment/Plan    PT Assessment Patient needs continued PT services  PT Diagnosis Difficulty walking   PT Problem List Decreased strength;Decreased range of motion;Decreased activity tolerance;Decreased mobility;Decreased knowledge of use  of DME;Pain  PT Treatment Interventions DME instruction;Gait training;Stair training;Functional mobility training;Therapeutic activities;Therapeutic exercise;Patient/family education   PT Goals (Current goals can be found in the Care Plan section) Acute Rehab PT Goals Patient Stated Goal: go home tomorrow PT Goal Formulation: With patient Time For Goal Achievement: 01/29/16 Potential to Achieve Goals: Good    Frequency  7X/week   Barriers to discharge        Co-evaluation               End of Session Equipment Utilized During Treatment: Gait belt Activity Tolerance: Patient tolerated treatment well Patient left: in chair;with call bell/phone within reach;with family/visitor present Nurse Communication: Mobility status         Time: [id number] PT Time Calculation (min) (ACUTE ONLY): 42 min   Charges:   PT Evaluation $PT Eval Low Complexity: 1 Procedure PT Treatments $Gait Training: 8-22 mins $Therapeutic Exercise: 8-22 mins   PT G Codes:        Kolbee Bogusz 2016/01/29, 12:48 PM

## 2016-01-26 LAB — CBC
HEMATOCRIT: 35 % — AB (ref 39.0–52.0)
HEMOGLOBIN: 11.6 g/dL — AB (ref 13.0–17.0)
MCH: 30 pg (ref 26.0–34.0)
MCHC: 33.1 g/dL (ref 30.0–36.0)
MCV: 90.4 fL (ref 78.0–100.0)
Platelets: 143 10*3/uL — ABNORMAL LOW (ref 150–400)
RBC: 3.87 MIL/uL — AB (ref 4.22–5.81)
RDW: 14.1 % (ref 11.5–15.5)
WBC: 11.4 10*3/uL — ABNORMAL HIGH (ref 4.0–10.5)

## 2016-01-26 LAB — BASIC METABOLIC PANEL
Anion gap: 7 (ref 5–15)
BUN: 14 mg/dL (ref 6–20)
CHLORIDE: 108 mmol/L (ref 101–111)
CO2: 26 mmol/L (ref 22–32)
Calcium: 9.3 mg/dL (ref 8.9–10.3)
Creatinine, Ser: 0.85 mg/dL (ref 0.61–1.24)
GFR calc non Af Amer: >60 mL/min (ref >60)
GLUCOSE: 131 mg/dL — AB (ref 65–99)
POTASSIUM: 4.5 mmol/L (ref 3.5–5.1)
Sodium: 141 mmol/L (ref 135–145)

## 2016-01-26 MED ORDER — TRAMADOL HCL 50 MG PO TABS: 50.0000 mg | ORAL_TABLET | Freq: Four times a day (QID) | ORAL | Status: DC | PRN

## 2016-01-26 MED ORDER — RIVAROXABAN 10 MG PO TABS: 10.0000 mg | ORAL_TABLET | Freq: Every day | ORAL | Status: DC

## 2016-01-26 MED ORDER — METHOCARBAMOL 500 MG PO TABS: 500.0000 mg | ORAL_TABLET | Freq: Four times a day (QID) | ORAL | Status: DC | PRN

## 2016-01-26 MED ORDER — HYDROMORPHONE HCL 2 MG PO TABS: 2.0000 mg | ORAL_TABLET | ORAL | Status: DC | PRN

## 2016-01-26 NOTE — Progress Notes (Signed)
Occupational Therapy Treatment Patient Details Name: BARNETT ELZEY MRN: [id number] DOB: [birth year] Today's Date: 01/26/2016    History of present illness R TKR revision   OT comments  All OT education completed and patient questions answered. No further OT needs identified. OT will sign off.  Follow Up Recommendations  No OT follow up;Supervision - Intermittent    Equipment Recommendations  None recommended by OT    Recommendations for Other Services      Precautions / Restrictions Precautions Precautions: Knee;Fall Restrictions Weight Bearing Restrictions: No RLE Weight Bearing: Weight bearing as tolerated       Mobility Bed Mobility                  Transfers                      Balance                                   ADL Overall ADL's : Needs assistance/impaired                                       General ADL Comments: Educated patient on tub transfer technique and provided demonstration. Patient and girlfriend verbalize understanding. Patient declined to practice, stating he has been doing the transfer this way since his first surgery in 2015. All questions answered. No further OT needs. Will sign off.      Vision                     Perception     Praxis      Cognition   Behavior During Therapy: WFL for tasks assessed/performed Overall Cognitive Status: Within Functional Limits for tasks assessed                       Extremity/Trunk Assessment               Exercises     Shoulder Instructions       General Comments      Pertinent Vitals/ Pain       Pain Assessment: 0-10 Pain Score: 4  Pain Location: R knee Pain Descriptors / Indicators: Aching;Sore Pain Intervention(s): Limited activity within patient's tolerance;Monitored during session;RN gave pain meds during session  Home Living                                          Prior  Functioning/Environment              Frequency       Progress Toward Goals  OT Goals(current goals can now be found in the care plan section)  Progress towards OT goals: Goals met/education completed, patient discharged from [facility] All goals met and education completed, patient discharged from OT services    Co-evaluation                 End of Session    Activity Tolerance Patient tolerated treatment well   Patient Left in chair;with call bell/phone within reach;with chair alarm set;with nursing/sitter in room;with family/visitor present   Nurse Communication Other (comment) (pt would like another pair TED hose)  Time: [id number] OT Time Calculation (min): 15 min  Charges: OT General Charges $OT Visit: 1 Procedure OT Treatments $Self Care/Home Management : 8-22 mins  Chanti Golubski A 01/26/2016, 10:06 AM

## 2016-01-26 NOTE — Progress Notes (Signed)
Physical Therapy Treatment Patient Details Name: Jeremy Webb MRN: [id number] DOB: October 24, 1956 Today's Date: 31-Jan-2016    History of Present Illness R TKR revision    PT Comments    Pt progressing well with mobility.  Reviewed stairs with written instructions provided.    Follow Up Recommendations  Home health PT     Equipment Recommendations  None recommended by PT    Recommendations for Other Services OT consult     Precautions / Restrictions Precautions Precautions: Knee;Fall Restrictions Weight Bearing Restrictions: No RLE Weight Bearing: Weight bearing as tolerated    Mobility  Bed Mobility Overal bed mobility: Modified Independent                Transfers Overall transfer level: Modified independent                  Ambulation/Gait Ambulation/Gait assistance: Supervision Ambulation Distance (Feet): 200 Feet Assistive device: Rolling walker (2 wheeled) Gait Pattern/deviations: Step-through pattern;Shuffle Gait velocity: decr   General Gait Details: min cues for position from RW   Stairs Stairs: Yes Stairs assistance: Min assist Stair Management: One rail Left;Step to pattern;Forwards;With crutches Number of Stairs: 4 General stair comments: min cues for sequence and foot/crutch placement  Wheelchair Mobility    Modified Rankin (Stroke Patients Only)       Balance                                    Cognition Arousal/Alertness: Awake/alert Behavior During Therapy: WFL for tasks assessed/performed Overall Cognitive Status: Within Functional Limits for tasks assessed                      Exercises Total Joint Exercises Ankle Circles/Pumps: AROM;Both;15 reps;Supine Quad Sets: AROM;Both;Supine;20 reps Heel Slides: AAROM;Right;Supine;20 reps Straight Leg Raises: AROM;Right;Supine;20 reps Long Arc Quad: AAROM;AROM;Right;10 reps;Seated Goniometric ROM: AAROm R knee -10 - 110    General Comments        Pertinent Vitals/Pain Pain Assessment: 0-10 Pain Score: 4  Pain Location: R knee Pain Descriptors / Indicators: Aching;Sore;Tightness Pain Intervention(s): Limited activity within patient's tolerance;Monitored during session;Premedicated before session;Ice applied    Home Living                      Prior Function            PT Goals (current goals can now be found in the care plan section) Acute Rehab PT Goals Patient Stated Goal: Regain IND PT Goal Formulation: With patient Time For Goal Achievement: 01/29/16 Potential to Achieve Goals: Good Progress towards PT goals: Progressing toward goals    Frequency  7X/week    PT Plan Current plan remains appropriate    Co-evaluation             End of Session Equipment Utilized During Treatment: Gait belt Activity Tolerance: Patient tolerated treatment well Patient left: in chair;with call bell/phone within reach;with family/visitor present     Time: [id number] PT Time Calculation (min) (ACUTE ONLY): 18 min  Charges:  $Gait Training: 8-22 mins $Therapeutic Exercise: 23-37 mins                    G Codes:      Shone Leventhal 01-31-2016, 12:36 PM

## 2016-01-26 NOTE — Discharge Summary (Signed)
Physician Discharge Summary   Patient ID: Jeremy Webb MRN: [id number] DOB/AGE: 1956-09-01 60 y.o.  Admit date: 01/24/2016 Discharge date: 01-26-2016    Primary Diagnosis:  Failed right total knee arthroplasty. Admission Diagnoses:  Past Medical History  Diagnosis Date  . Dysrhythmia     atrial fib  . GERD (gastroesophageal reflux disease)   . Arthritis   . Anemia     hx of   Discharge Diagnoses:   Principal Problem:   Failed total right knee replacement ([place]) Active Problems:   Failed total knee, right ([place])  Estimated body mass index is 36.34 kg/(m^2) as calculated from the following:   Height as of this encounter: 6' (1.829 m).   Weight as of this encounter: 121.564 kg (268 lb).  Procedure:  Procedure(s) (LRB): RIGHT TOTAL KNEE REVISION  (Right)   Consults: None  HPI: Jeremy Webb is a 60 year old male, had a right total knee arthroplasty done approximately year and half to 2 years ago. He has had persistent pain and instability-type symptoms since. I saw him in second opinion few months ago. All of his blood work was normal and did not show signs of infection. Attempted aspiration showed no fluid. Bone scan did show loosening of his tibial component. He presents now for resection arthroplasty versus revision total knee Arthroplasty.  Laboratory Data: Admission on 01/24/2016  Component Date Value Ref Range Status  . Specimen Description 01/24/2016 SYNOVIAL RIGHT KNEE   Final  . Special Requests 01/24/2016 NONE   Final  . Gram Stain 01/24/2016    Final                   Value:NO WBC SEEN NO ORGANISMS SEEN Gram Stain Report Called to,Read Back By and Verified With: NEAL,C. RN @1114  ON 2.22.17 BY MCCOY,N.   . Culture 01/24/2016    Final                   Value:NO GROWTH 1 DAY Performed at [facility]   . Report Status 01/24/2016 PENDING   Incomplete  . WBC 01/25/2016 12.5* 4.0 - 10.5 K/uL Final  . RBC 01/25/2016 4.15* 4.22 - 5.81 MIL/uL  Final  . Hemoglobin 01/25/2016 12.3* 13.0 - 17.0 g/dL Final  . HCT 01/25/2016 36.9* 39.0 - 52.0 % Final  . MCV 01/25/2016 88.9  78.0 - 100.0 fL Final  . MCH 01/25/2016 29.6  26.0 - 34.0 pg Final  . MCHC 01/25/2016 33.3  30.0 - 36.0 g/dL Final  . RDW 01/25/2016 13.5  11.5 - 15.5 % Final  . Platelets 01/25/2016 158  150 - 400 K/uL Final  . Sodium 01/25/2016 137  135 - 145 mmol/L Final  . Potassium 01/25/2016 4.2  3.5 - 5.1 mmol/L Final  . Chloride 01/25/2016 104  101 - 111 mmol/L Final  . CO2 01/25/2016 24  22 - 32 mmol/L Final  . Glucose, Bld 01/25/2016 144* 65 - 99 mg/dL Final  . BUN 01/25/2016 12  6 - 20 mg/dL Final  . Creatinine, Ser 01/25/2016 1.01  0.61 - 1.24 mg/dL Final  . Calcium 01/25/2016 8.9  8.9 - 10.3 mg/dL Final  . GFR calc non Af Amer 01/25/2016 >60  >60 mL/min Final  . GFR calc Af Amer 01/25/2016 >60  >60 mL/min Final   Comment: (NOTE) The eGFR has been calculated using the CKD EPI equation. This calculation has not been validated in all clinical situations. eGFR's persistently <60 mL/min signify possible Chronic Kidney Disease.   Marland Kitchen  Anion gap 01/25/2016 9  5 - 15 Final  . WBC 01/26/2016 11.4* 4.0 - 10.5 K/uL Final  . RBC 01/26/2016 3.87* 4.22 - 5.81 MIL/uL Final  . Hemoglobin 01/26/2016 11.6* 13.0 - 17.0 g/dL Final  . HCT 01/26/2016 35.0* 39.0 - 52.0 % Final  . MCV 01/26/2016 90.4  78.0 - 100.0 fL Final  . MCH 01/26/2016 30.0  26.0 - 34.0 pg Final  . MCHC 01/26/2016 33.1  30.0 - 36.0 g/dL Final  . RDW 01/26/2016 14.1  11.5 - 15.5 % Final  . Platelets 01/26/2016 143* 150 - 400 K/uL Final  . Sodium 01/26/2016 141  135 - 145 mmol/L Final  . Potassium 01/26/2016 4.5  3.5 - 5.1 mmol/L Final  . Chloride 01/26/2016 108  101 - 111 mmol/L Final  . CO2 01/26/2016 26  22 - 32 mmol/L Final  . Glucose, Bld 01/26/2016 131* 65 - 99 mg/dL Final  . BUN 01/26/2016 14  6 - 20 mg/dL Final  . Creatinine, Ser 01/26/2016 0.85  0.61 - 1.24 mg/dL Final  . Calcium 01/26/2016 9.3  8.9 -  10.3 mg/dL Final  . GFR calc non Af Amer 01/26/2016 >60  >60 mL/min Final  . GFR calc Af Amer 01/26/2016 >60  >60 mL/min Final   Comment: (NOTE) The eGFR has been calculated using the CKD EPI equation. This calculation has not been validated in all clinical situations. eGFR's persistently <60 mL/min signify possible Chronic Kidney Disease.   Georgiann Hahn gap 01/26/2016 7  5 - 15 Final  Hospital Outpatient Visit on 01/15/2016  Component Date Value Ref Range Status  . MRSA, PCR 01/15/2016 NEGATIVE  NEGATIVE Final  . Staphylococcus aureus 01/15/2016 NEGATIVE  NEGATIVE Final   Comment:        The Xpert SA Assay (FDA approved for NASAL specimens in patients over 64 years of age), is one component of a comprehensive surveillance program.  Test performance has been validated by [facility] for patients greater than or equal to 18 year old. It is not intended to diagnose infection nor to guide or monitor treatment.   Marland Kitchen aPTT 01/15/2016 27  24 - 37 seconds Final  . WBC 01/15/2016 4.1  4.0 - 10.5 K/uL Final  . RBC 01/15/2016 4.55  4.22 - 5.81 MIL/uL Final  . Hemoglobin 01/15/2016 13.2  13.0 - 17.0 g/dL Final  . HCT 01/15/2016 40.0  39.0 - 52.0 % Final  . MCV 01/15/2016 87.9  78.0 - 100.0 fL Final  . MCH 01/15/2016 29.0  26.0 - 34.0 pg Final  . MCHC 01/15/2016 33.0  30.0 - 36.0 g/dL Final  . RDW 01/15/2016 13.4  11.5 - 15.5 % Final  . Platelets 01/15/2016 126* 150 - 400 K/uL Final  . Sodium 01/15/2016 138  135 - 145 mmol/L Final  . Potassium 01/15/2016 4.1  3.5 - 5.1 mmol/L Final  . Chloride 01/15/2016 106  101 - 111 mmol/L Final  . CO2 01/15/2016 25  22 - 32 mmol/L Final  . Glucose, Bld 01/15/2016 103* 65 - 99 mg/dL Final  . BUN 01/15/2016 12  6 - 20 mg/dL Final  . Creatinine, Ser 01/15/2016 0.98  0.61 - 1.24 mg/dL Final  . Calcium 01/15/2016 9.1  8.9 - 10.3 mg/dL Final  . Total Protein 01/15/2016 6.9  6.5 - 8.1 g/dL Final  . Albumin 01/15/2016 4.2  3.5 - 5.0 g/dL Final  . AST  01/15/2016 20  15 - 41 U/L Final  . ALT 01/15/2016 21  17 - 63 U/L Final  . Alkaline Phosphatase 01/15/2016 99  38 - 126 U/L Final  . Total Bilirubin 01/15/2016 0.9  0.3 - 1.2 mg/dL Final  . GFR calc non Af Amer 01/15/2016 >60  >60 mL/min Final  . GFR calc Af Amer 01/15/2016 >60  >60 mL/min Final   Comment: (NOTE) The eGFR has been calculated using the CKD EPI equation. This calculation has not been validated in all clinical situations. eGFR's persistently <60 mL/min signify possible Chronic Kidney Disease.   . Anion gap 01/15/2016 7  5 - 15 Final  . Prothrombin Time 01/15/2016 13.1  11.6 - 15.2 seconds Final  . INR 01/15/2016 0.97  0.00 - 1.49 Final  . ABO/RH(D) 01/15/2016 A POS   Final  . Antibody Screen 01/15/2016 NEG   Final  . Sample Expiration 01/15/2016 01/27/2016   Final  . Extend sample reason 01/15/2016 NO TRANSFUSIONS OR PREGNANCY IN THE PAST 3 MONTHS   Final  . Color, Urine 01/15/2016 YELLOW  YELLOW Final  . APPearance 01/15/2016 CLEAR  CLEAR Final  . Specific Gravity, Urine 01/15/2016 1.016  1.005 - 1.030 Final  . pH 01/15/2016 6.0  5.0 - 8.0 Final  . Glucose, UA 01/15/2016 NEGATIVE  NEGATIVE mg/dL Final  . Hgb urine dipstick 01/15/2016 NEGATIVE  NEGATIVE Final  . Bilirubin Urine 01/15/2016 NEGATIVE  NEGATIVE Final  . Ketones, ur 01/15/2016 NEGATIVE  NEGATIVE mg/dL Final  . Protein, ur 01/15/2016 NEGATIVE  NEGATIVE mg/dL Final  . Nitrite 01/15/2016 NEGATIVE  NEGATIVE Final  . Leukocytes, UA 01/15/2016 NEGATIVE  NEGATIVE Final   MICROSCOPIC NOT DONE ON URINES WITH NEGATIVE PROTEIN, BLOOD, LEUKOCYTES, NITRITE, OR GLUCOSE <1000 mg/dL.  . ABO/RH(D) 01/15/2016 A POS   Final     X-Rays:No results found.  EKG:No orders found for this or any previous visit.   Hospital Course: TAHEEM FRICKE is a 60 y.o. who was admitted to [facility]. They were brought to the operating room on 01/24/2016 and underwent Procedure(s): RIGHT TOTAL KNEE REVISION .  Patient tolerated  the procedure well and was later transferred to the recovery room and then to the orthopaedic floor for postoperative care.  They were given PO and IV analgesics for pain control following their surgery.  They were given 24 hours of postoperative antibiotics of  Anti-infectives    Start     Dose/Rate Route Frequency Ordered Stop   01/24/16 1600  ceFAZolin (ANCEF) IVPB 2 g/50 mL premix     2 g 100 mL/hr over 30 Minutes Intravenous Every 6 hours 01/24/16 1523 01/24/16 2345   01/24/16 0600  ceFAZolin (ANCEF) 3 g in dextrose 5 % 50 mL IVPB     3 g 130 mL/hr over 30 Minutes Intravenous On call to O.R. 01/23/16 1306 01/24/16 1000     and started on DVT prophylaxis in the form of Xarelto.   PT and OT were ordered for total joint protocol.  Discharge planning consulted to help with postop disposition and equipment needs.  Patient had a good night on the evening of surgery.  They started to get up OOB with therapy on day one. Hemovac drain was pulled without difficulty.  Continued to work with therapy into day two.  Dressing was changed on day two and the incision was healing well.   Patient was seen in rounds on POD 2 and was ready to go home.  Discharge home with home health Diet - Cardiac diet Follow up - in 2 weeks Activity -  WBAT Disposition - Home Condition Upon Discharge - Good D/C Meds - See DC Summary      Discharge Instructions    Call MD / Call 911    Complete by:  As directed   If you experience chest pain or shortness of breath, CALL 911 and be transported to the hospital emergency room.  If you develope a fever above 101 F, pus (white drainage) or increased drainage or redness at the wound, or calf pain, call your surgeon's office.     Change dressing    Complete by:  As directed   Change dressing daily with sterile 4 x 4 inch gauze dressing and apply TED hose. Do not submerge the incision under water.     Constipation Prevention    Complete by:  As directed   Drink plenty of  fluids.  Prune juice may be helpful.  You may use a stool softener, such as Colace (over the counter) 100 mg twice a day.  Use MiraLax (over the counter) for constipation as needed.     Diet - low sodium heart healthy    Complete by:  As directed      Discharge instructions    Complete by:  As directed   Pick up stool softner and laxative for home use following surgery while on pain medications. Do not submerge incision under water. Please use good hand washing techniques while changing dressing each day. May shower starting three days after surgery. Please use a clean towel to pat the incision dry following showers. Continue to use ice for pain and swelling after surgery. Do not use any lotions or creams on the incision until instructed by your surgeon.  Take Xarelto for two and a half more weeks, then discontinue Xarelto. Once the patient has completed the blood thinner regimen, then take a Baby 81 mg Aspirin daily for three more weeks.  Postoperative Constipation Protocol  Constipation - defined medically as fewer than three stools per week and severe constipation as less than one stool per week.  One of the most common issues patients have following surgery is constipation.  Even if you have a regular bowel pattern at home, your normal regimen is likely to be disrupted due to multiple reasons following surgery.  Combination of anesthesia, postoperative narcotics, change in appetite and fluid intake all can affect your bowels.  In order to avoid complications following surgery, here are some recommendations in order to help you during your recovery period.  Colace (docusate) - Pick up an over-the-counter form of Colace or another stool softener and take twice a day as long as you are requiring postoperative pain medications.  Take with a full glass of water daily.  If you experience loose stools or diarrhea, hold the colace until you stool forms back up.  If your symptoms do not get better  within 1 week or if they get worse, check with your doctor.  Dulcolax (bisacodyl) - Pick up over-the-counter and take as directed by the product packaging as needed to assist with the movement of your bowels.  Take with a full glass of water.  Use this product as needed if not relieved by Colace only.   MiraLax (polyethylene glycol) - Pick up over-the-counter to have on hand.  MiraLax is a solution that will increase the amount of water in your bowels to assist with bowel movements.  Take as directed and can mix with a glass of water, juice, soda, coffee, or tea.  Take if  you go more than two days without a movement. Do not use MiraLax more than once per day. Call your doctor if you are still constipated or irregular after using this medication for 7 days in a row.  If you continue to have problems with postoperative constipation, please contact the office for further assistance and recommendations.  If you experience "the worst abdominal pain ever" or develop nausea or vomiting, please contact the office immediatly for further recommendations for treatment..     Do not put a pillow under the knee. Place it under the heel.    Complete by:  As directed      Do not sit on low chairs, stoools or toilet seats, as it may be difficult to get up from low surfaces    Complete by:  As directed      Driving restrictions    Complete by:  As directed   No driving until released by the physician.     Increase activity slowly as tolerated    Complete by:  As directed      Lifting restrictions    Complete by:  As directed   No lifting until released by the physician.     Patient may shower    Complete by:  As directed   You may shower without a dressing once there is no drainage.  Do not wash over the wound.  If drainage remains, do not shower until drainage stops.     TED hose    Complete by:  As directed   Use stockings (TED hose) for 3 weeks on both leg(s).  You may remove them at night for sleeping.      Weight bearing as tolerated    Complete by:  As directed   Laterality:  right  Extremity:  Lower            Medication List    STOP taking these medications        celecoxib 200 MG capsule  Commonly known as:  CELEBREX     HYDROcodone-acetaminophen 5-325 MG tablet  Commonly known as:  NORCO/VICODIN     TURMERIC PO     VITAMIN B 12 PO     VITAMIN C PO     VOLTAREN 1 % Gel  Generic drug:  diclofenac sodium      TAKE these medications        ALPRAZolam 0.25 MG tablet  Commonly known as:  XANAX  TAKE ONE TABLET BY MOUTH NIGHTLY AS NEEDED FOR SLEEP     HYDROmorphone 2 MG tablet  Commonly known as:  DILAUDID  Take 1-2 tablets (2-4 mg total) by mouth every 3 (three) hours as needed for moderate pain or severe pain.     methocarbamol 500 MG tablet  Commonly known as:  ROBAXIN  Take 1 tablet (500 mg total) by mouth every 6 (six) hours as needed for muscle spasms.     pantoprazole 40 MG tablet  Commonly known as:  PROTONIX  Take 40 mg by mouth daily as needed. Acid rerlux     rivaroxaban 10 MG Tabs tablet  Commonly known as:  XARELTO  Take 1 tablet (10 mg total) by mouth daily with breakfast. Take Xarelto for two and a half more weeks, then discontinue Xarelto. Once the patient has completed the blood thinner regimen, then take a Baby 81 mg Aspirin daily for three more weeks.     traMADol 50 MG tablet  Commonly known as:  Veatrice Bourbon  Take 1-2 tablets (50-100 mg total) by mouth every 6 (six) hours as needed (mild pain).       Follow-up Information    Follow up with [facility].   Why:  home health physical therapy   Contact information:   [place] [address] [phone]       Follow up with Gearlean Alf, MD. Schedule an appointment as soon as possible for a visit on 02/06/2016.   Specialty:  Orthopedic Surgery   Why:  Call office at [phone] to setup appointment on Tuesday 02/06/2016 with Dr. Wynelle Link.   Contact information:     [address] [place] [address] [phone]       Signed: Arlee Muslim, PA-C Orthopaedic Surgery 01/26/2016, 9:17 AM

## 2016-01-26 NOTE — Progress Notes (Signed)
   Subjective: 2 Days Post-Op Procedure(s) (LRB): RIGHT TOTAL KNEE REVISION  (Right) Patient reports pain as mild.   Patient seen in rounds by Dr. Wynelle Link. Patient is well, and has had no acute complaints or problems Patient is ready to go home  Objective: Vital signs in last 24 hours: Temp:  [97 F (36.1 C)-98.5 F (36.9 C)] 97.8 F (36.6 C) (02/24 1354) Pulse Rate:  [57-69] 69 (02/24 1354) Resp:  [16] 16 (02/24 1354) BP: (123-133)/(61-63) 133/62 mmHg (02/24 1354) SpO2:  [96 %-97 %] 97 % (02/24 1354)  Intake/Output from previous day:  Intake/Output Summary (Last 24 hours) at 01/26/16 1534 Last data filed at 01/26/16 1355  Gross per 24 hour  Intake   1560 ml  Output   3745 ml  Net  -2185 ml    Intake/Output this shift: Total I/O In: 480 [P.O.:480] Out: 400 [Urine:400]  Labs:  Recent Labs  01/25/16 0410 01/26/16 0407  HGB 12.3* 11.6*    Recent Labs  01/25/16 0410 01/26/16 0407  WBC 12.5* 11.4*  RBC 4.15* 3.87*  HCT 36.9* 35.0*  PLT 158 143*    Recent Labs  01/25/16 0410 01/26/16 0407  NA 137 141  K 4.2 4.5  CL 104 108  CO2 24 26  BUN 12 14  CREATININE 1.01 0.85  GLUCOSE 144* 131*  CALCIUM 8.9 9.3   No results for input(s): LABPT, INR in the last 72 hours.  EXAM: General - Patient is Alert, Appropriate and Oriented Extremity - Neurovascular intact Sensation intact distally Dorsiflexion/Plantar flexion intact Incision - clean, dry, no drainage Motor Function - intact, moving foot and toes well on exam.   Assessment/Plan: 2 Days Post-Op Procedure(s) (LRB): RIGHT TOTAL KNEE REVISION  (Right) Procedure(s) (LRB): RIGHT TOTAL KNEE REVISION  (Right) Past Medical History  Diagnosis Date  . Dysrhythmia     atrial fib  . GERD (gastroesophageal reflux disease)   . Arthritis   . Anemia     hx of   Principal Problem:   Failed total right knee replacement ([facility]) Active Problems:   Failed total knee, right ([facility])  Estimated body mass index  is 36.34 kg/(m^2) as calculated from the following:   Height as of this encounter: 6' (1.829 m).   Weight as of this encounter: 121.564 kg (268 lb). Up with therapy Discharge home with home health Diet - Cardiac diet Follow up - in 2 weeks Activity - WBAT Disposition - Home Condition Upon Discharge - Good D/C Meds - See DC Summary DVT Prophylaxis - [place], PA-C Orthopaedic Surgery 01/26/2016, 3:34 PM

## 2016-01-26 NOTE — Progress Notes (Signed)
Physical Therapy Treatment Patient Details Name: Jeremy Webb MRN: [id number] DOB: 1956-08-23 Today's Date: Feb 01, 2016    History of Present Illness R TKR revision    PT Comments    Pt progressing well and very motivated but re-cautioned about overdoing initially.  Follow Up Recommendations  Home health PT     Equipment Recommendations  None recommended by PT    Recommendations for Other Services OT consult     Precautions / Restrictions Precautions Precautions: Knee;Fall Restrictions Weight Bearing Restrictions: No RLE Weight Bearing: Weight bearing as tolerated    Mobility  Bed Mobility Overal bed mobility: Modified Independent                Transfers Overall transfer level: Modified independent                  Ambulation/Gait Ambulation/Gait assistance: Supervision Ambulation Distance (Feet): 5 Feet Assistive device: Rolling walker (2 wheeled) Gait Pattern/deviations: Step-to pattern;Decreased step length - right;Decreased step length - left;Shuffle     General Gait Details: Pt from bed to chair for bfast   Stairs            Wheelchair Mobility    Modified Rankin (Stroke Patients Only)       Balance                                    Cognition Arousal/Alertness: Awake/alert Behavior During Therapy: WFL for tasks assessed/performed Overall Cognitive Status: Within Functional Limits for tasks assessed                      Exercises Total Joint Exercises Ankle Circles/Pumps: AROM;Both;15 reps;Supine Quad Sets: AROM;Both;Supine;20 reps Heel Slides: AAROM;Right;Supine;20 reps Straight Leg Raises: AROM;Right;Supine;20 reps Long Arc Quad: AAROM;AROM;Right;10 reps;Seated Goniometric ROM: AAROm R knee -10 - 110    General Comments        Pertinent Vitals/Pain Pain Assessment: 0-10 Pain Score: 4  Pain Location: R knee Pain Descriptors / Indicators: Aching;Sore Pain Intervention(s): Limited  activity within patient's tolerance;Monitored during session;RN gave pain meds during session    Home Living                      Prior Function            PT Goals (current goals can now be found in the care plan section) Acute Rehab PT Goals Patient Stated Goal: Regain IND PT Goal Formulation: With patient Time For Goal Achievement: 01/29/16 Potential to Achieve Goals: Good Progress towards PT goals: Progressing toward goals    Frequency  7X/week    PT Plan Current plan remains appropriate    Co-evaluation             End of Session Equipment Utilized During Treatment: Gait belt Activity Tolerance: Patient tolerated treatment well Patient left: in chair;with call bell/phone within reach     Time: 0823-0850 PT Time Calculation (min) (ACUTE ONLY): 27 min  Charges:  $Therapeutic Exercise: 23-37 mins                    G Codes:      Emoni Yang 01-Feb-2016, 12:31 PM

## 2016-01-27 LAB — BODY FLUID CULTURE
CULTURE: NO GROWTH
Gram Stain: NONE SEEN

## 2016-01-29 LAB — ANAEROBIC CULTURE: GRAM STAIN: NONE SEEN

## 2016-11-01 NOTE — Progress Notes (Signed)
Surgery on 11/13/16.  preop on 12/8.  Need orders in EPIC Thank You

## 2016-11-05 NOTE — Progress Notes (Signed)
Need SURGICAL ORDER IN EPIC PLEASE--appt 11/08/16  thanks

## 2016-11-05 NOTE — Patient Instructions (Addendum)
Jeremy Webb  11/05/2016   Your procedure is scheduled on: 11-13-16  Report to [facility] Main  Entrance take [place]  elevators to 3rd floor to  [place] at 1015 AM.  Call this number if you have problems the morning of surgery [phone]   Remember: ONLY 1 PERSON MAY GO WITH YOU TO SHORT STAY TO GET  READY MORNING OF [place].  Do not eat food  :After Midnight, MAY HAVE CLEA LIQUIDS FROM MIDNIGHT UNTIL 715 AM DAY OF SURGERY, NOTHING BY MOUTH AFTER 715 AM DAY OF SURGERY.      Take these medicines the morning of surgery with A SIP OF WATER: Pantoprazole, Xanax if needed.              You may not have any metal on your body including hair pins and              piercings  Do not wear jewelry, make-up, lotions, powders or perfumes, deodorant             Do not wear nail polish.  Do not shave  48 hours prior to surgery.              Men may shave face and neck.   Do not bring valuables to the hospital. [place].  Contacts, dentures or bridgework may not be worn into surgery.  Leave suitcase in the car. After surgery it may be brought to your room.     Patients discharged the day of surgery will not be allowed to drive home.  Name and phone number of your driver:  Special Instructions: N/A              Please read over the following fact sheets you were given: _____________________________________________________________________                CLEAR LIQUID DIET   Foods Allowed                                                                     Foods Excluded  Coffee and tea, regular and decaf                             liquids that you cannot  Plain Jell-O in any flavor                                             see through such as: Fruit ices (not with fruit pulp)                                     milk, soups, orange juice  Iced Popsicles  All solid food Carbonated beverages, regular and diet                                    Cranberry, grape and apple juices Sports drinks like Gatorade Lightly seasoned clear broth or consume(fat free) Sugar, honey syrup  Sample Menu Breakfast                                Lunch                                     Supper Cranberry juice                    Beef broth                            Chicken broth Jell-O                                     Grape juice                           Apple juice Coffee or tea                        Jell-O                                      Popsicle                                                Coffee or tea                        Coffee or tea  _____________________________________________________________________  [facility] - Preparing for Surgery Before surgery, you can play an important role.  Because skin is not sterile, your skin needs to be as free of germs as possible.  You can reduce the number of germs on your skin by washing with CHG (chlorahexidine gluconate) soap before surgery.  CHG is an antiseptic cleaner which kills germs and bonds with the skin to continue killing germs even after washing. Please DO NOT use if you have an allergy to CHG or antibacterial soaps.  If your skin becomes reddened/irritated stop using the CHG and inform your nurse when you arrive at Short Stay. Do not shave (including legs and underarms) for at least 48 hours prior to the first CHG shower.  You may shave your face/neck. Please follow these instructions carefully:  1.  Shower with CHG Soap the night before surgery and the  morning of Surgery.  2.  If you choose to wash your hair, wash your hair first as usual with your  normal  shampoo.  3.  After you shampoo, rinse your hair and body thoroughly to remove the  shampoo.  4.  Use CHG as you would any other liquid soap.  You can apply chg directly  to the skin and wash                        Gently with a scrungie or clean washcloth.  5.  Apply the CHG Soap to your body ONLY FROM THE NECK DOWN.   Do not use on face/ open                           Wound or open sores. Avoid contact with eyes, ears mouth and genitals (private parts).                       Wash face,  Genitals (private parts) with your normal soap.             6.  Wash thoroughly, paying special attention to the area where your surgery  will be performed.  7.  Thoroughly rinse your body with warm water from the neck down.  8.  DO NOT shower/wash with your normal soap after using and rinsing off  the CHG Soap.                9.  Pat yourself dry with a clean towel.            10.  Wear clean pajamas.            11.  Place clean sheets on your bed the night of your first shower and do not  sleep with pets. Day of Surgery : Do not apply any lotions/deodorants the morning of surgery.  Please wear clean clothes to the hospital/surgery center.  FAILURE TO FOLLOW THESE INSTRUCTIONS MAY RESULT IN THE CANCELLATION OF YOUR SURGERY PATIENT SIGNATURE_________________________________  NURSE SIGNATURE__________________________________  ________________________________________________________________________   [name]  An incentive spirometer is a tool that can help keep your lungs clear and active. This tool measures how well you are filling your lungs with each breath. Taking long deep breaths may help reverse or decrease the chance of developing breathing (pulmonary) problems (especially infection) following:  A long period of time when you are unable to move or be active. BEFORE THE PROCEDURE   If the spirometer includes an indicator to show your best effort, your nurse or respiratory therapist will set it to a desired goal.  If possible, sit up straight or lean slightly forward. Try not to slouch.  Hold the incentive spirometer in an upright position. INSTRUCTIONS FOR USE  1. Sit on the edge of your  bed if possible, or sit up as far as you can in bed or on a chair. 2. Hold the incentive spirometer in an upright position. 3. Breathe out normally. 4. Place the mouthpiece in your mouth and seal your lips tightly around it. 5. Breathe in slowly and as deeply as possible, raising the piston or the ball toward the top of the column. 6. Hold your breath for 3-5 seconds or for as long as possible. Allow the piston or ball to fall to the bottom of the column. 7. Remove the mouthpiece from your mouth and breathe out normally. 8. Rest for a few seconds and repeat Steps 1 through 7 at least 10 times every 1-2 hours when you are awake. Take your time and take a few normal breaths between deep breaths. 9. The spirometer may include an indicator to  show your best effort. Use the indicator as a goal to work toward during each repetition. 10. After each set of 10 deep breaths, practice coughing to be sure your lungs are clear. If you have an incision (the cut made at the time of surgery), support your incision when coughing by placing a pillow or rolled up towels firmly against it. Once you are able to get out of bed, walk around indoors and cough well. You may stop using the incentive spirometer when instructed by your caregiver.  RISKS AND COMPLICATIONS  Take your time so you do not get dizzy or light-headed.  If you are in pain, you may need to take or ask for pain medication before doing incentive spirometry. It is harder to take a deep breath if you are having pain. AFTER USE  Rest and breathe slowly and easily.  It can be helpful to keep track of a log of your progress. Your caregiver can provide you with a simple table to help with this. If you are using the spirometer at home, follow these instructions: [place] IF:   You are having difficultly using the spirometer.  You have trouble using the spirometer as often as instructed.  Your pain medication is not giving enough relief while  using the spirometer.  You develop fever of 100.5 F (38.1 C) or higher. SEEK IMMEDIATE MEDICAL CARE IF:   You cough up bloody sputum that had not been present before.  You develop fever of 102 F (38.9 C) or greater.  You develop worsening pain at or near the incision site. MAKE SURE YOU:   Understand these instructions.  Will watch your condition.  Will get help right away if you are not doing well or get worse. Document Released: 03/31/2007 Document Revised: 02/10/2012 Document Reviewed: 06/01/2007 [facility] Patient Information 2014 [place], [place].   ________________________________________________________________________

## 2016-11-07 ENCOUNTER — Ambulatory Visit: Payer: Self-pay | Admitting: Orthopedic Surgery

## 2016-11-07 NOTE — Progress Notes (Signed)
PLEASE PLACE SURGICAL ORDERS IN EPIC-

## 2016-11-08 ENCOUNTER — Encounter (INDEPENDENT_AMBULATORY_CARE_PROVIDER_SITE_OTHER): Payer: Self-pay

## 2016-11-08 ENCOUNTER — Encounter (HOSPITAL_COMMUNITY)
Admission: RE | Admit: 2016-11-08 | Discharge: 2016-11-08 | Disposition: A | Discharge status: Home / Self Care | Payer: 59 | Source: Ambulatory Visit | Attending: Orthopedic Surgery | Admitting: Orthopedic Surgery

## 2016-11-08 ENCOUNTER — Encounter (HOSPITAL_COMMUNITY): Payer: Self-pay

## 2016-11-08 DIAGNOSIS — Z01812 Encounter for preprocedural laboratory examination: Secondary | ICD-10-CM | POA: Insufficient documentation

## 2016-11-08 HISTORY — DX: Personal history of urinary calculi: Z87.442

## 2016-11-08 LAB — BASIC METABOLIC PANEL
ANION GAP: 6 (ref 5–15)
BUN: 14 mg/dL (ref 6–20)
CO2: 26 mmol/L (ref 22–32)
Calcium: 9 mg/dL (ref 8.9–10.3)
Chloride: 105 mmol/L (ref 101–111)
Creatinine, Ser: 0.95 mg/dL (ref 0.61–1.24)
GFR calc Af Amer: >60 mL/min (ref >60)
GFR calc non Af Amer: >60 mL/min (ref >60)
GLUCOSE: 118 mg/dL — AB (ref 65–99)
POTASSIUM: 4 mmol/L (ref 3.5–5.1)
Sodium: 137 mmol/L (ref 135–145)

## 2016-11-08 LAB — CBC
HEMATOCRIT: 39.7 % (ref 39.0–52.0)
HEMOGLOBIN: 13.6 g/dL (ref 13.0–17.0)
MCH: 29.2 pg (ref 26.0–34.0)
MCHC: 34.3 g/dL (ref 30.0–36.0)
MCV: 85.4 fL (ref 78.0–100.0)
Platelets: 141 10*3/uL — ABNORMAL LOW (ref 150–400)
RBC: 4.65 MIL/uL (ref 4.22–5.81)
RDW: 13.5 % (ref 11.5–15.5)
WBC: 4.4 10*3/uL (ref 4.0–10.5)

## 2016-11-08 NOTE — Pre-Procedure Instructions (Addendum)
EKG 08-27-16 on chart. Cardiac LOV 08-27-16 on chart Stress Echo 10-08-16 on chart

## 2016-11-12 MED ORDER — DEXTROSE 5 % IV SOLN
3.0000 g | INTRAVENOUS | Status: AC
  Administered 2016-11-13: 3 g via INTRAVENOUS
  Filled 2016-11-12: qty 3

## 2016-11-12 NOTE — H&P (Signed)
CC- Jeremy Webb is a 60 y.o. male who presents with right knee pain.  HPI- . Knee Pain: Patient presents with knee pain involving the  right knee. Onset of the symptoms was several months ago. Inciting event: He had a revision right TKA performed 01/24/16 and did well initially but after a few months developed lateral and anterior knee pain, There was concern over injury to the medial retinaculum early post-op but he did not have pain at that time. The pain has progressed and he has developed patellar maltracking. He presents now for patellar realignment.   Past Medical History:  Diagnosis Date  . Anemia    hx of  . Arthritis   . Dysrhythmia    atrial fib  . GERD (gastroesophageal reflux disease)   . History of kidney stones     Past Surgical History:  Procedure Laterality Date  . JOINT REPLACEMENT     right knee replacement 2015 x 2  . left shoulder arthroscopic x 2    . nose surgery as teenager    . right elbow arthroscopic    . right knee arthoscopic - 2010    . right shoulder arthoscopic    . TONSILLECTOMY    . TOTAL KNEE REVISION Right 01/24/2016   Procedure: RIGHT TOTAL KNEE REVISION ;  Surgeon: Gaynelle Arabian, MD;  Location: [facility] ORS;  Service: Orthopedics;  Laterality: Right;    Prior to Admission medications   Medication Sig Start Date End Date Taking? Authorizing Provider  ALPRAZolam (XANAX) 0.25 MG tablet TAKE ONE TABLET (0.25 MG) BY MOUTH AT BEDTIME AS NEEDED FOR SLEEP/SPASMS 08/02/15  Yes Historical Provider, MD  Aspirin-Acetaminophen-Caffeine (GOODYS EXTRA STRENGTH) [phone] MG PACK Take 1 packet by mouth daily as needed (for headaches/pain.).   Yes Historical Provider, MD  pantoprazole (PROTONIX) 40 MG tablet Take 40 mg by mouth daily as needed (for acid reflux.).  03/25/15  Yes Historical Provider, MD  rOPINIRole (REQUIP) 0.25 MG tablet Take 0.25-0.5 mg by mouth 3 (three) times daily. 0.25 mg in the morning, 0.25 mg in the afternoon, and 0.5 mg at bedtime.   Yes  Historical Provider, MD   KNEE EXAM antalgic gait, soft tissue tenderness over lateral knee, no effusion, patella tracks laterally  Physical Examination: General appearance - alert, well appearing, and in no distress Mental status - alert, oriented to person, place, and time Chest - clear to auscultation, no wheezes, rales or rhonchi, symmetric air entry Heart - normal rate, regular rhythm, normal S1, S2, no murmurs, rubs, clicks or gallops Abdomen - soft, nontender, nondistended, no masses or organomegaly Neurological - alert, oriented, normal speech, no focal findings or movement disorder noted   Asessment/Plan--- Right knee patellar instability- - Plan right knee patellar realignment. Procedure risks and potential comps discussed with patient who elects to proceed. Goals are decreased pain and increased function with a high likelihood of achieving both

## 2016-11-13 ENCOUNTER — Encounter (HOSPITAL_COMMUNITY): Payer: Self-pay | Admitting: *Deleted

## 2016-11-13 ENCOUNTER — Ambulatory Visit (HOSPITAL_COMMUNITY): Payer: 59 | Admitting: Certified Registered"

## 2016-11-13 ENCOUNTER — Encounter (HOSPITAL_COMMUNITY)
Admission: RE | Disposition: A | Discharge status: Home / Self Care | Payer: Self-pay | Source: Ambulatory Visit | Attending: Orthopedic Surgery

## 2016-11-13 ENCOUNTER — Ambulatory Visit (HOSPITAL_COMMUNITY)
Admission: RE | Admit: 2016-11-13 | Discharge: 2016-11-15 | Disposition: A | Discharge status: Home / Self Care | Payer: 59 | Source: Ambulatory Visit | Attending: Orthopedic Surgery | Admitting: Orthopedic Surgery

## 2016-11-13 DIAGNOSIS — M1990 Unspecified osteoarthritis, unspecified site: Secondary | ICD-10-CM | POA: Insufficient documentation

## 2016-11-13 DIAGNOSIS — Z96651 Presence of right artificial knee joint: Secondary | ICD-10-CM | POA: Insufficient documentation

## 2016-11-13 DIAGNOSIS — M2351 Chronic instability of knee, right knee: Secondary | ICD-10-CM | POA: Diagnosis present

## 2016-11-13 DIAGNOSIS — Z87442 Personal history of urinary calculi: Secondary | ICD-10-CM | POA: Diagnosis not present

## 2016-11-13 DIAGNOSIS — Z880 Allergy status to penicillin: Secondary | ICD-10-CM | POA: Diagnosis not present

## 2016-11-13 DIAGNOSIS — K219 Gastro-esophageal reflux disease without esophagitis: Secondary | ICD-10-CM | POA: Diagnosis not present

## 2016-11-13 DIAGNOSIS — Z7982 Long term (current) use of aspirin: Secondary | ICD-10-CM | POA: Insufficient documentation

## 2016-11-13 DIAGNOSIS — Z91041 Radiographic dye allergy status: Secondary | ICD-10-CM | POA: Diagnosis not present

## 2016-11-13 DIAGNOSIS — I4891 Unspecified atrial fibrillation: Secondary | ICD-10-CM | POA: Diagnosis not present

## 2016-11-13 DIAGNOSIS — M25361 Other instability, right knee: Secondary | ICD-10-CM | POA: Diagnosis present

## 2016-11-13 HISTORY — PX: PATELLAR REEFING: SHX5418

## 2016-11-13 SURGERY — PATELLAR REEFING
Anesthesia: General | Site: Knee | Laterality: Right

## 2016-11-13 MED ORDER — PROPOFOL 10 MG/ML IV BOLUS
INTRAVENOUS | Status: AC
  Filled 2016-11-13: qty 20

## 2016-11-13 MED ORDER — MIDAZOLAM HCL 2 MG/2ML IJ SOLN
INTRAMUSCULAR | Status: AC
  Filled 2016-11-13: qty 2

## 2016-11-13 MED ORDER — LACTATED RINGERS IV SOLN
INTRAVENOUS | Status: DC
  Administered 2016-11-13: 13:00:00 via INTRAVENOUS
  Administered 2016-11-13: 1000 mL via INTRAVENOUS
  Administered 2016-11-13: 12:00:00 via INTRAVENOUS

## 2016-11-13 MED ORDER — ONDANSETRON HCL 4 MG/2ML IJ SOLN
INTRAMUSCULAR | Status: DC | PRN
  Administered 2016-11-13: 4 mg via INTRAVENOUS

## 2016-11-13 MED ORDER — ACETAMINOPHEN 10 MG/ML IV SOLN
1000.0000 mg | Freq: Once | INTRAVENOUS | Status: AC
  Administered 2016-11-13: 1000 mg via INTRAVENOUS

## 2016-11-13 MED ORDER — HYDROMORPHONE HCL 2 MG/ML IJ SOLN
INTRAMUSCULAR | Status: AC
  Administered 2016-11-13: 0.5 mg via INTRAVENOUS
  Filled 2016-11-13: qty 1

## 2016-11-13 MED ORDER — METHOCARBAMOL 500 MG PO TABS
500.0000 mg | ORAL_TABLET | Freq: Four times a day (QID) | ORAL | Status: DC | PRN
  Administered 2016-11-15: 500 mg via ORAL
  Filled 2016-11-13 (×2): qty 1

## 2016-11-13 MED ORDER — OXYCODONE HCL 5 MG PO TABS
5.0000 mg | ORAL_TABLET | ORAL | Status: DC | PRN
  Administered 2016-11-13 – 2016-11-15 (×8): 15 mg via ORAL
  Administered 2016-11-15: 20 mg via ORAL
  Administered 2016-11-15: 15 mg via ORAL
  Filled 2016-11-13: qty 3
  Filled 2016-11-13: qty 4
  Filled 2016-11-13 (×6): qty 3
  Filled 2016-11-13: qty 4
  Filled 2016-11-13: qty 3

## 2016-11-13 MED ORDER — BUPIVACAINE HCL (PF) 0.25 % IJ SOLN
INTRAMUSCULAR | Status: AC
  Filled 2016-11-13: qty 30

## 2016-11-13 MED ORDER — FENTANYL CITRATE (PF) 100 MCG/2ML IJ SOLN
INTRAMUSCULAR | Status: AC
  Filled 2016-11-13: qty 2

## 2016-11-13 MED ORDER — ROPINIROLE HCL 0.5 MG PO TABS
0.5000 mg | ORAL_TABLET | Freq: Every day | ORAL | Status: DC
  Administered 2016-11-13 – 2016-11-14 (×2): 0.5 mg via ORAL
  Filled 2016-11-13 (×5): qty 1

## 2016-11-13 MED ORDER — ACETAMINOPHEN 10 MG/ML IV SOLN
INTRAVENOUS | Status: AC
  Filled 2016-11-13: qty 100

## 2016-11-13 MED ORDER — KETOROLAC TROMETHAMINE 30 MG/ML IJ SOLN: 30.0000 mg | Freq: Once | INTRAMUSCULAR | Status: DC | PRN

## 2016-11-13 MED ORDER — HYDROMORPHONE HCL 2 MG/ML IJ SOLN
0.2500 mg | INTRAMUSCULAR | Status: DC | PRN
  Administered 2016-11-13 (×4): 0.5 mg via INTRAVENOUS

## 2016-11-13 MED ORDER — MIDAZOLAM HCL 2 MG/2ML IJ SOLN
INTRAMUSCULAR | Status: DC | PRN
  Administered 2016-11-13: 2 mg via INTRAVENOUS

## 2016-11-13 MED ORDER — BUPIVACAINE HCL (PF) 0.25 % IJ SOLN
INTRAMUSCULAR | Status: DC | PRN
  Administered 2016-11-13: 20 mL

## 2016-11-13 MED ORDER — MORPHINE SULFATE (PF) 2 MG/ML IV SOLN
1.0000 mg | INTRAVENOUS | Status: DC | PRN
  Administered 2016-11-13 (×2): 1 mg via INTRAVENOUS
  Filled 2016-11-13 (×2): qty 1

## 2016-11-13 MED ORDER — ACETAMINOPHEN 500 MG PO TABS
1000.0000 mg | ORAL_TABLET | Freq: Four times a day (QID) | ORAL | Status: AC
  Administered 2016-11-13 – 2016-11-14 (×3): 1000 mg via ORAL
  Filled 2016-11-13 (×3): qty 2

## 2016-11-13 MED ORDER — PHENYLEPHRINE 40 MCG/ML (10ML) SYRINGE FOR IV PUSH (FOR BLOOD PRESSURE SUPPORT)
PREFILLED_SYRINGE | INTRAVENOUS | Status: DC | PRN
  Administered 2016-11-13 (×2): 80 ug via INTRAVENOUS

## 2016-11-13 MED ORDER — CHLORHEXIDINE GLUCONATE 4 % EX LIQD: 60.0000 mL | Freq: Once | CUTANEOUS | Status: DC

## 2016-11-13 MED ORDER — ONDANSETRON HCL 4 MG/2ML IJ SOLN: 4.0000 mg | Freq: Four times a day (QID) | INTRAMUSCULAR | Status: DC | PRN

## 2016-11-13 MED ORDER — PANTOPRAZOLE SODIUM 40 MG PO TBEC
40.0000 mg | DELAYED_RELEASE_TABLET | Freq: Every day | ORAL | Status: DC | PRN
  Administered 2016-11-14: 40 mg via ORAL
  Filled 2016-11-13: qty 1

## 2016-11-13 MED ORDER — CEFAZOLIN SODIUM-DEXTROSE 2-4 GM/100ML-% IV SOLN
2.0000 g | Freq: Four times a day (QID) | INTRAVENOUS | Status: AC
  Administered 2016-11-13 – 2016-11-14 (×3): 2 g via INTRAVENOUS
  Filled 2016-11-13 (×3): qty 100

## 2016-11-13 MED ORDER — METOCLOPRAMIDE HCL 5 MG PO TABS: 5.0000 mg | ORAL_TABLET | Freq: Three times a day (TID) | ORAL | Status: DC | PRN

## 2016-11-13 MED ORDER — PROPOFOL 10 MG/ML IV BOLUS
INTRAVENOUS | Status: DC | PRN
  Administered 2016-11-13: 200 mg via INTRAVENOUS

## 2016-11-13 MED ORDER — POVIDONE-IODINE 10 % EX SWAB: 2.0000 "application " | Freq: Once | CUTANEOUS | Status: DC

## 2016-11-13 MED ORDER — 0.9 % SODIUM CHLORIDE (POUR BTL) OPTIME
TOPICAL | Status: DC | PRN
  Administered 2016-11-13: 1000 mL

## 2016-11-13 MED ORDER — FENTANYL CITRATE (PF) 100 MCG/2ML IJ SOLN
INTRAMUSCULAR | Status: DC | PRN
  Administered 2016-11-13 (×3): 50 ug via INTRAVENOUS
  Administered 2016-11-13: 100 ug via INTRAVENOUS
  Administered 2016-11-13: 50 ug via INTRAVENOUS

## 2016-11-13 MED ORDER — TRAMADOL HCL 50 MG PO TABS
50.0000 mg | ORAL_TABLET | Freq: Four times a day (QID) | ORAL | Status: DC | PRN
  Administered 2016-11-13: 100 mg via ORAL
  Filled 2016-11-13: qty 2

## 2016-11-13 MED ORDER — SODIUM CHLORIDE 0.9 % IV SOLN
INTRAVENOUS | Status: DC
  Administered 2016-11-13 – 2016-11-14 (×2): via INTRAVENOUS

## 2016-11-13 MED ORDER — METOCLOPRAMIDE HCL 5 MG/ML IJ SOLN: 5.0000 mg | Freq: Three times a day (TID) | INTRAMUSCULAR | Status: DC | PRN

## 2016-11-13 MED ORDER — ONDANSETRON HCL 4 MG PO TABS: 4.0000 mg | ORAL_TABLET | Freq: Four times a day (QID) | ORAL | Status: DC | PRN

## 2016-11-13 MED ORDER — OXYCODONE HCL 5 MG PO TABS
5.0000 mg | ORAL_TABLET | ORAL | Status: DC | PRN
  Administered 2016-11-13: 5 mg via ORAL
  Filled 2016-11-13: qty 1

## 2016-11-13 MED ORDER — METHOCARBAMOL 1000 MG/10ML IJ SOLN
500.0000 mg | Freq: Four times a day (QID) | INTRAVENOUS | Status: DC | PRN
  Administered 2016-11-13: 500 mg via INTRAVENOUS
  Filled 2016-11-13: qty 550
  Filled 2016-11-13: qty 5

## 2016-11-13 MED ORDER — LIDOCAINE 2% (20 MG/ML) 5 ML SYRINGE
INTRAMUSCULAR | Status: DC | PRN
  Administered 2016-11-13: 60 mg via INTRAVENOUS

## 2016-11-13 MED ORDER — ENOXAPARIN SODIUM 40 MG/0.4ML ~~LOC~~ SOLN
40.0000 mg | SUBCUTANEOUS | Status: DC
  Filled 2016-11-13: qty 0.4

## 2016-11-13 MED ORDER — PROMETHAZINE HCL 25 MG/ML IJ SOLN: 6.2500 mg | INTRAMUSCULAR | Status: DC | PRN

## 2016-11-13 MED ORDER — DEXAMETHASONE SODIUM PHOSPHATE 10 MG/ML IJ SOLN
10.0000 mg | Freq: Once | INTRAMUSCULAR | Status: AC
  Administered 2016-11-13: 10 mg via INTRAVENOUS

## 2016-11-13 MED ORDER — ALPRAZOLAM 0.25 MG PO TABS: 0.2500 mg | ORAL_TABLET | Freq: Every evening | ORAL | Status: DC | PRN

## 2016-11-13 MED ORDER — ROPINIROLE HCL 0.5 MG PO TABS
0.2500 mg | ORAL_TABLET | ORAL | Status: DC
  Administered 2016-11-14 – 2016-11-15 (×3): 0.25 mg via ORAL

## 2016-11-13 SURGICAL SUPPLY — 47 items
BANDAGE ACE 6X5 VEL STRL LF (GAUZE/BANDAGES/DRESSINGS) ×2 IMPLANT
BANDAGE ESMARK 6X9 LF (GAUZE/BANDAGES/DRESSINGS) ×1 IMPLANT
BIT DRILL 2.4X128 (BIT) ×1 IMPLANT
BNDG CMPR 9X6 STRL LF SNTH (GAUZE/BANDAGES/DRESSINGS) ×1
BNDG ESMARK 6X9 LF (GAUZE/BANDAGES/DRESSINGS) ×2
CHLORAPREP W/TINT 26ML (MISCELLANEOUS) ×1 IMPLANT
CUFF TOURN SGL QUICK 34 (TOURNIQUET CUFF) ×2
CUFF TRNQT CYL 34X4X40X1 (TOURNIQUET CUFF) ×1 IMPLANT
DRAPE POUCH INSTRU U-SHP 10X18 (DRAPES) ×2 IMPLANT
DRAPE U-SHAPE 47X51 STRL (DRAPES) ×2 IMPLANT
DRSG ADAPTIC 3X8 NADH LF (GAUZE/BANDAGES/DRESSINGS) ×2 IMPLANT
DRSG PAD ABDOMINAL 8X10 ST (GAUZE/BANDAGES/DRESSINGS) ×2 IMPLANT
ELECT REM PT RETURN 9FT ADLT (ELECTROSURGICAL) ×2
ELECTRODE REM PT RTRN 9FT ADLT (ELECTROSURGICAL) ×1 IMPLANT
EVACUATOR 1/8 PVC DRAIN (DRAIN) ×1 IMPLANT
GAUZE SPONGE 4X4 12PLY STRL (GAUZE/BANDAGES/DRESSINGS) ×2 IMPLANT
GLOVE BIO SURGEON STRL SZ8 (GLOVE) ×2 IMPLANT
GLOVE BIOGEL PI IND STRL 7.0 (GLOVE) IMPLANT
GLOVE BIOGEL PI IND STRL 7.5 (GLOVE) IMPLANT
GLOVE BIOGEL PI IND STRL 8 (GLOVE) ×2 IMPLANT
GLOVE BIOGEL PI INDICATOR 7.0 (GLOVE) ×1
GLOVE BIOGEL PI INDICATOR 7.5 (GLOVE) ×1
GLOVE BIOGEL PI INDICATOR 8 (GLOVE) ×1
GLOVE SURG SS PI 7.0 STRL IVOR (GLOVE) ×2 IMPLANT
GOWN STRL REUS W/TWL LRG LVL3 (GOWN DISPOSABLE) ×2 IMPLANT
GOWN STRL REUS W/TWL XL LVL3 (GOWN DISPOSABLE) ×2 IMPLANT
IMMOBILIZER KNEE 20 (SOFTGOODS) ×2
IMMOBILIZER KNEE 20 THIGH 36 (SOFTGOODS) ×1 IMPLANT
MANIFOLD NEPTUNE II (INSTRUMENTS) ×2 IMPLANT
NDL MA TROC 1/2 (NEEDLE) ×1 IMPLANT
NDL MAYO CATGUT SZ4 TPR NDL (NEEDLE) IMPLANT
NEEDLE MA TROC 1/2 (NEEDLE) IMPLANT
NEEDLE MAYO CATGUT SZ4 (NEEDLE) IMPLANT
PACK TOTAL JOINT (CUSTOM PROCEDURE TRAY) ×2 IMPLANT
PAD ABD 8X10 STRL (GAUZE/BANDAGES/DRESSINGS) ×1 IMPLANT
PADDING CAST COTTON 6X4 STRL (CAST SUPPLIES) ×3 IMPLANT
PASSER SUT SWANSON 36MM LOOP (INSTRUMENTS) ×2 IMPLANT
POSITIONER SURGICAL ARM (MISCELLANEOUS) ×2 IMPLANT
STAPLER VISISTAT 35W (STAPLE) ×1 IMPLANT
STRIP CLOSURE SKIN 1/2X4 (GAUZE/BANDAGES/DRESSINGS) ×4 IMPLANT
SUT ETHIBOND NAB CT1 #1 30IN (SUTURE) ×3 IMPLANT
SUT MNCRL AB 4-0 PS2 18 (SUTURE) ×2 IMPLANT
SUT PDS AB 1 CT1 27 (SUTURE) ×2 IMPLANT
SUT VIC AB 2-0 CT1 27 (SUTURE) ×2
SUT VIC AB 2-0 CT1 TAPERPNT 27 (SUTURE) ×2 IMPLANT
TOWEL OR 17X26 10 PK STRL BLUE (TOWEL DISPOSABLE) ×2 IMPLANT
WRAP KNEE MAXI GEL POST OP (GAUZE/BANDAGES/DRESSINGS) ×1 IMPLANT

## 2016-11-13 NOTE — Brief Op Note (Signed)
11/13/2016  1:34 PM  PATIENT:  Jeremy Webb  60 y.o. male  PRE-OPERATIVE DIAGNOSIS:  Patellar instability Right knee   POST-OPERATIVE DIAGNOSIS:  Patellar instability Right knee   PROCEDURE:  Procedure(s): Right Knee Patellar Realignment (Right)  SURGEON:  Surgeon(s) and Role:    Gaynelle Arabian, MD - Primary  PHYSICIAN ASSISTANT:   ASSISTANTS: none   ANESTHESIA:   general  EBL:  Total I/O In: 1000 [I.V.:1000] Out: -   BLOOD ADMINISTERED:none  DRAINS: (Medium) Hemovact drain(s) in the right knee with  Suction Open   LOCAL MEDICATIONS USED:  MARCAINE     COUNTS:  YES  TOURNIQUET:   Total Tourniquet Time Documented: Thigh (Right) - 24 minutes Total: Thigh (Right) - 24 minutes   DICTATION: .Other Dictation: Dictation Number [id number]  PLAN OF CARE: Admit for overnight observation  PATIENT DISPOSITION:  PACU - hemodynamically stable.

## 2016-11-13 NOTE — Anesthesia Procedure Notes (Signed)
Procedure Name: LMA Insertion Date/Time: 11/13/2016 12:31 PM Performed by: Cynda Familia Pre-anesthesia Checklist: Patient identified, Emergency Drugs available, Suction available and Patient being monitored Patient Re-evaluated:Patient Re-evaluated prior to inductionOxygen Delivery Method: Circle System Utilized Preoxygenation: Pre-oxygenation with 100% oxygen Intubation Type: IV induction Ventilation: Mask ventilation without difficulty LMA: LMA inserted LMA Size: 5.0 Tube type: Oral Number of attempts: 1 Placement Confirmation: positive ETCO2 Tube secured with: Tape Dental Injury: Teeth and Oropharynx as per pre-operative assessment  Comments: Smooth IV induction--- by Ola Spurr--- LMA atraumatic teeth and mouth as preop-- bilat BS

## 2016-11-13 NOTE — Anesthesia Postprocedure Evaluation (Signed)
Anesthesia Post Note  Patient: Jeremy Webb  Procedure(s) Performed: Procedure(s) (LRB): Right Knee Patellar Realignment (Right)  Patient location during evaluation: PACU Anesthesia Type: General Level of consciousness: awake and alert Pain management: pain level controlled Vital Signs Assessment: post-procedure vital signs reviewed and stable Respiratory status: spontaneous breathing, nonlabored ventilation, respiratory function stable and patient connected to nasal cannula oxygen Cardiovascular status: blood pressure returned to baseline and stable Postop Assessment: no signs of nausea or vomiting Anesthetic complications: no    Last Vitals:  Vitals:   11/13/16 1415 11/13/16 1430  BP: 124/81 (!) 147/75  Pulse: 62 (!) 56  Resp: 16 16  Temp: 36.3 C     Last Pain:  Vitals:   11/13/16 1405  TempSrc:   PainSc: 6                  Tiajuana Amass

## 2016-11-13 NOTE — Interval H&P Note (Signed)
History and Physical Interval Note:  11/13/2016 11:20 AM  Jeremy Webb  has presented today for surgery, with the diagnosis of Patellar instability Right knee   The various methods of treatment have been discussed with the patient and family. After consideration of risks, benefits and other options for treatment, the patient has consented to  Procedure(s): Right knee patellar realignment (Right) as a surgical intervention .  The patient's history has been reviewed, patient examined, no change in status, stable for surgery.  I have reviewed the patient's chart and labs.  Questions were answered to the patient's satisfaction.     Gearlean Alf

## 2016-11-13 NOTE — Anesthesia Preprocedure Evaluation (Addendum)
Anesthesia Evaluation  Patient identified by MRN, date of birth, ID band Patient awake    Reviewed: Allergy & Precautions, H&P , Patient's Chart, lab work & pertinent test results  Airway Mallampati: II  TM Distance: >3 FB Neck ROM: full    Dental no notable dental hx. (+) Dental Advisory Given   Pulmonary neg pulmonary ROS,    Pulmonary exam normal breath sounds clear to auscultation       Cardiovascular Exercise Tolerance: Good  Rhythm:regular Rate:Normal     Neuro/Psych negative neurological ROS     GI/Hepatic Neg liver ROS, GERD  ,  Endo/Other  negative endocrine ROS  Renal/GU negative Renal ROS     Musculoskeletal  (+) Arthritis ,   Abdominal   Peds  Hematology negative hematology ROS (+)   Anesthesia Other Findings   Reproductive/Obstetrics                            Anesthesia Physical  Anesthesia Plan  ASA: II  Anesthesia Plan: General   Post-op Pain Management:    Induction: Intravenous  Airway Management Planned: LMA  Additional Equipment:   Intra-op Plan:   Post-operative Plan: Extubation in OR  Informed Consent: I have reviewed the patients History and Physical, chart, labs and discussed the procedure including the risks, benefits and alternatives for the proposed anesthesia with the patient or authorized representative who has indicated his/her understanding and acceptance.   Dental Advisory Given  Plan Discussed with: CRNA  Anesthesia Plan Comments: ( )        Anesthesia Quick Evaluation

## 2016-11-13 NOTE — Transfer of Care (Signed)
Immediate Anesthesia Transfer of Care Note  Patient: Jeremy Webb  Procedure(s) Performed: Procedure(s): Right Knee Patellar Realignment (Right)  Patient Location: PACU  Anesthesia Type:General  Level of Consciousness: awake and alert   Airway & Oxygen Therapy: Patient Spontanous Breathing and Patient connected to face mask oxygen  Post-op Assessment: Report given to RN and Post -op Vital signs reviewed and stable  Post vital signs: Reviewed and stable  Last Vitals:  Vitals:   11/13/16 1018  BP: (!) 142/80  Pulse: 66  Resp: 18  Temp: 36.4 C    Last Pain:  Vitals:   11/13/16 1018  TempSrc: Oral      Patients Stated Pain Goal: 3 ([id number])  Complications: No apparent anesthesia complications

## 2016-11-13 NOTE — Interval H&P Note (Signed)
History and Physical Interval Note:  11/13/2016 12:18 PM  Jeremy Webb  has presented today for surgery, with the diagnosis of Patellar instability Right knee   The various methods of treatment have been discussed with the patient and family. After consideration of risks, benefits and other options for treatment, the patient has consented to  Procedure(s): Right knee patellar realignment (Right) as a surgical intervention .  The patient's history has been reviewed, patient examined, no change in status, stable for surgery.  I have reviewed the patient's chart and labs.  Questions were answered to the patient's satisfaction.     Gearlean Alf

## 2016-11-14 DIAGNOSIS — M2351 Chronic instability of knee, right knee: Secondary | ICD-10-CM | POA: Diagnosis not present

## 2016-11-14 MED ORDER — ASPIRIN 325 MG PO TABS
325.0000 mg | ORAL_TABLET | Freq: Every day | ORAL | Status: DC
  Administered 2016-11-14: 325 mg via ORAL
  Filled 2016-11-14: qty 1

## 2016-11-14 MED ORDER — METHOCARBAMOL 500 MG PO TABS: 500.0000 mg | ORAL_TABLET | Freq: Four times a day (QID) | ORAL | 0 refills | Status: DC | PRN

## 2016-11-14 MED ORDER — TRAMADOL HCL 50 MG PO TABS: 50.0000 mg | ORAL_TABLET | Freq: Four times a day (QID) | ORAL | 1 refills | Status: DC | PRN

## 2016-11-14 MED ORDER — OXYCODONE HCL 5 MG PO TABS: 5.0000 mg | ORAL_TABLET | ORAL | 0 refills | Status: DC | PRN

## 2016-11-14 MED ORDER — LIDOCAINE HCL (PF) 1 % IJ SOLN: 30.0000 mL | Freq: Once | INTRAMUSCULAR | Status: DC

## 2016-11-14 MED ORDER — ASPIRIN EC 325 MG PO TBEC: 325.0000 mg | DELAYED_RELEASE_TABLET | Freq: Every day | ORAL | 0 refills | Status: DC

## 2016-11-14 MED ORDER — LIDOCAINE HCL 1 % IJ SOLN
20.0000 mL | Freq: Once | INTRAMUSCULAR | Status: DC
Start: 2016-11-14 — End: 2016-11-15
  Filled 2016-11-14: qty 20

## 2016-11-14 NOTE — Progress Notes (Signed)
Physical Therapy Treatment Patient Details Name: Jeremy Webb MRN: [id number] DOB: 1956-06-20 Today's Date: 11/14/2016    History of Present Illness patellar revision R TKA    PT Comments    The patient is mobilizing well. Reinforced need for KI snug to ambulate.   Follow Up Recommendations  No PT follow up     Equipment Recommendations  None recommended by PT    Recommendations for Other Services       Precautions / Restrictions Precautions Precaution Comments: KNEE IMMOBILIZER AT ALL TIMES. NO ROM RIGHT KNEE Required Braces or Orthoses: Knee Immobilizer - Right Knee Immobilizer - Right: On at all times Restrictions RLE Weight Bearing: Weight bearing as tolerated    Mobility  Bed Mobility      Transfers Overall transfer level: Needs assistance Equipment used: Rolling walker (2 wheeled) Transfers: Sit to/from Stand Sit to Stand: Supervision         General transfer comment: cues formsafety, impulsive  Ambulation/Gait Ambulation/Gait assistance: Supervision Ambulation Distance (Feet): 400 Feet Assistive device: Rolling walker (2 wheeled) Gait Pattern/deviations: Step-through pattern     General Gait Details: cues for safety   Stairs Stairs: Yes   Stair Management: No rails;Step to pattern;Backwards;With walker Number of Stairs: 2 General stair comments: practiced x 2 with patient and wife  Wheelchair Mobility    Modified Rankin (Stroke Patients Only)       Balance                                    Cognition Arousal/Alertness: Awake/alert Behavior During Therapy: Impulsive Overall Cognitive Status: Within Functional Limits for tasks assessed                      Exercises      General Comments        Pertinent Vitals/Pain Pain Score: 2  Pain Location: right knee Pain Descriptors / Indicators: Sore Pain Intervention(s): Premedicated before session;Repositioned    Home Living Family/patient expects  to be discharged to:: Private residence Living Arrangements: Spouse/significant other Available Help at Discharge: Friend(s);Available 24 hours/day Type of Home: House Home Access: Stairs to enter Entrance Stairs-Rails: Right Home Layout: One level Home Equipment: Walker - 2 wheels;Crutches;Bedside commode      Prior Function Level of Independence: Independent with assistive device(s)          PT Goals (current goals can now be found in the care plan section) Acute Rehab PT Goals Patient Stated Goal: to walk, go home PT Goal Formulation: With patient Time For Goal Achievement: 11/16/16 Potential to Achieve Goals: Good Progress towards PT goals: Progressing toward goals    Frequency    7X/week      PT Plan Current plan remains appropriate    Co-evaluation             End of Session Equipment Utilized During Treatment: Right knee immobilizer Activity Tolerance: Patient tolerated treatment well Patient left: in chair;with call bell/phone within reach;with chair alarm set     Time: [id number] PT Time Calculation (min) (ACUTE ONLY): 13 min  Charges:  $Gait Training: 8-22 mins                    G Codes:  Functional Assessment Tool Used: clinical judgement Functional Limitation: Mobility: Walking and moving around Mobility: Walking and Moving Around Current Status [id number]): At least 20 percent but less than 40  percent impaired, limited or restricted Mobility: Walking and Moving Around Goal Status [phone]): At least 1 percent but less than 20 percent impaired, limited or restricted   Claretha Cooper 11/14/2016, 3:53 PM

## 2016-11-14 NOTE — Evaluation (Signed)
Physical Therapy Evaluation Patient Details Name: Jeremy Webb MRN: [id number] DOB: [birth year] Today's Date: 11/14/2016   History of Present Illness  patellar revision R TKA  Clinical Impression  Reviewed nmo bending of the right knee. The patient is ambulating well. Will practice steps next visit. No active bleeding following mobility. Pt admitted with above diagnosis. Pt currently with functional limitations due to the deficits listed below (see PT Problem List).  Pt will benefit from skilled PT to increase their independence and safety with mobility to allow discharge to the venue listed below.       Follow Up Recommendations No PT follow up    Equipment Recommendations  None recommended by PT    Recommendations for Other Services       Precautions / Restrictions Precautions Precaution Comments: KNEE IMMOBILIZER AT ALL TIMES. NO ROM RIGHT KNEE Required Braces or Orthoses: Knee Immobilizer - Right Knee Immobilizer - Right: On at all times Restrictions RLE Weight Bearing: Weight bearing as tolerated      Mobility  Bed Mobility Overal bed mobility: Needs Assistance Bed Mobility: Supine to Sit     Supine to sit: Supervision     General bed mobility comments: managed leg  Transfers Overall transfer level: Needs assistance Equipment used: Rolling walker (2 wheeled) Transfers: Sit to/from Stand Sit to Stand: Min guard         General transfer comment: cues for hand and right leg  Ambulation/Gait Ambulation/Gait assistance: Supervision Ambulation Distance (Feet): 400 Feet Assistive device: Rolling walker (2 wheeled) Gait Pattern/deviations: Step-to pattern;Step-through pattern     General Gait Details: cues for safety  Stairs            Wheelchair Mobility    Modified Rankin (Stroke Patients Only)       Balance                                             Pertinent Vitals/Pain Pain Score: 3  Pain Location: right  knee Pain Descriptors / Indicators: Sore Pain Intervention(s): Monitored during session;Premedicated before session;Repositioned    Home Living Family/patient expects to be discharged to:: Private residence Living Arrangements: Spouse/significant other Available Help at Discharge: Friend(s);Available 24 hours/day Type of Home: House Home Access: Stairs to enter Entrance Stairs-Rails: Right Entrance Stairs-Number of Steps: 3 Home Layout: One level Home Equipment: Walker - 2 wheels;Crutches;Bedside commode      Prior Function Level of Independence: Independent with assistive device(s)               Hand Dominance   Dominant Hand: Right    Extremity/Trunk Assessment   Upper Extremity Assessment Upper Extremity Assessment: Overall WFL for tasks assessed    Lower Extremity Assessment Lower Extremity Assessment: RLE deficits/detail RLE Deficits / Details: right KI       Communication   Communication: No difficulties  Cognition Arousal/Alertness: Awake/alert Behavior During Therapy: WFL for tasks assessed/performed Overall Cognitive Status: Within Functional Limits for tasks assessed                      General Comments      Exercises     Assessment/Plan    PT Assessment Patient needs continued PT services  PT Problem List Decreased strength;Decreased activity tolerance;Decreased mobility;Decreased knowledge of precautions;Decreased knowledge of use of DME;Decreased safety awareness  PT Treatment Interventions DME instruction;Gait training;Functional mobility training;Stair training;Therapeutic activities;Patient/family education    PT Goals (Current goals can be found in the Care Plan section)  Acute Rehab PT Goals Patient Stated Goal: to walk, go home PT Goal Formulation: With patient Time For Goal Achievement: 11/16/16 Potential to Achieve Goals: Good    Frequency 7X/week   Barriers to discharge        Co-evaluation                End of Session Equipment Utilized During Treatment: Right knee immobilizer Activity Tolerance: Patient tolerated treatment well Patient left: in chair;with call bell/phone within reach;with chair alarm set Nurse Communication: Mobility status    Functional Assessment Tool Used: clinical judgement Functional Limitation: Mobility: Walking and moving around Mobility: Walking and Moving Around Current Status [id number]): At least 20 percent but less than 40 percent impaired, limited or restricted Mobility: Walking and Moving Around Goal Status [phone]): At least 1 percent but less than 20 percent impaired, limited or restricted    Time: 0940-0955 PT Time Calculation (min) (ACUTE ONLY): 15 min   Charges:         PT G Codes:   PT G-Codes **NOT FOR INPATIENT CLASS** Functional Assessment Tool Used: clinical judgement Functional Limitation: Mobility: Walking and moving around Mobility: Walking and Moving Around Current Status [id number]): At least 20 percent but less than 40 percent impaired, limited or restricted Mobility: Walking and Moving Around Goal Status [phone]): At least 1 percent but less than 20 percent impaired, limited or restricted    Claretha Cooper 11/14/2016, 12:39 PM Tresa Endo PT [phone]

## 2016-11-14 NOTE — Progress Notes (Signed)
   Subjective: 1 Day Post-Op Procedure(s) (LRB): Right Knee Patellar Realignment (Right) Patient reports pain as mild.   Patient seen in rounds by Dr. Wynelle Link. Patient is well, and has had no acute complaints or problems Patient is ready to go home later this morning. HV drain stopped working last night.  Pulled this morning and has been draining requiring reinforcement of the site. Will make sure that this stops and under control. If does okay, then home later around lunch.  Objective: Vital signs in last 24 hours: Temp:  [97.3 F (36.3 C)-98.5 F (36.9 C)] 97.6 F (36.4 C) (12/14 0634) Pulse Rate:  [56-77] 77 (12/14 0634) Resp:  [11-18] 18 (12/14 0634) BP: (116-147)/(61-84) 116/74 (12/14 0634) SpO2:  [96 %-100 %] 97 % (12/14 0634) Weight:  [121.6 kg (268 lb 1.3 oz)] 121.6 kg (268 lb 1.3 oz) (12/13 1430)  Intake/Output from previous day:  Intake/Output Summary (Last 24 hours) at 11/14/16 0751 Last data filed at 11/14/16 0659  Gross per 24 hour  Intake          3556.67 ml  Output             2935 ml  Net           621.67 ml    Intake/Output this shift: No intake/output data recorded.  Labs: No results for input(s): HGB in the last 72 hours. No results for input(s): WBC, RBC, HCT, PLT in the last 72 hours. No results for input(s): NA, K, CL, CO2, BUN, CREATININE, GLUCOSE, CALCIUM in the last 72 hours. No results for input(s): LABPT, INR in the last 72 hours.  EXAM: General - Patient is Alert, Appropriate and Oriented Extremity - Neurovascular intact Sensation intact distally Intact pulses distally Dorsiflexion/Plantar flexion intact Dressing - bleeding drainage from pulled HV site, reinforced Motor Function - intact, moving foot and toes well on exam.   Assessment/Plan: 1 Day Post-Op Procedure(s) (LRB): Right Knee Patellar Realignment (Right) Procedure(s) (LRB): Right Knee Patellar Realignment (Right) Past Medical History:  Diagnosis Date  . Anemia    hx of    . Arthritis   . Dysrhythmia    atrial fib  . GERD (gastroesophageal reflux disease)   . History of kidney stones    Active Problems:   Patellar instability of right knee  Estimated body mass index is 35.37 kg/m as calculated from the following:   Height as of this encounter: 6\' 1"  (1.854 m).   Weight as of this encounter: 121.6 kg (268 lb 1.3 oz). Discharge home Diet - Cardiac diet Follow up - in 1 week, next Thursday pm 11/21/16 Activity - WBAT Disposition - Home Condition Upon Discharge - Good D/C Meds - See DC Summary DVT Prophylaxis - Aspirin daily for three weeks.  Arlee Muslim, PA-C Orthopaedic Surgery 11/14/2016, 7:51 AM

## 2016-11-14 NOTE — Op Note (Signed)
Jeremy Webb, BULOW.:  [id number]  MEDICAL RECORD NO.:  [id number]  LOCATION:                                 FACILITY:  PHYSICIAN:  Gaynelle Arabian, M.D.         DATE OF BIRTH:  DATE OF PROCEDURE:  11/13/2016 DATE OF DISCHARGE:                              OPERATIVE REPORT   PREOPERATIVE DIAGNOSIS:  Patellar instability, right knee.  POSTOPERATIVE DIAGNOSIS:  Patellar instability, right knee.  PROCEDURE:  Right knee patellar realignment.  SURGEON:  Gaynelle Arabian, M.D.  ASSISTANT:  No assistant.  ANESTHESIA:  General.  ESTIMATED BLOOD LOSS:  Minimal.  DRAINS:  Hemovac x1.  TOURNIQUET TIME:  24 minutes at 300 mmHg.  COMPLICATIONS:  None.  CONDITION:  Stable to recovery.  BRIEF CLINICAL NOTE:  [name] is a 60 year old male on whom I performed a right total knee arthroplasty revision in February.  Did very well initially and then a few weeks postop, started to develop anterior discomfort.  There was question of whether he may have disrupted his medial retinaculum.  He still has good strength and the alignment seemed normal.  Overtime, however, he started to develop some lateral subluxation of patella and increasing discomfort.  He presents now for patellar realignment.  PROCEDURE IN DETAIL:  After successful administration of general anesthetic, exam under anesthesia performed, and I placed knee through a range of motion.  Patella was definitely tracking laterally in flexion. Then, the right lower extremity was prepped and draped in usual sterile fashion.  Extremity was wrapped in Esmarch, and tourniquet inflated to 300 mmHg.  Medial parapatellar incision was utilized.  This was old incision.  We cut the skin with a 10 blade through subcutaneous tissue. Subcutaneous flaps were created.  A fresh blade was used to make a medial arthrotomy.  There was redundant tissue medially.  I thoroughly irrigated the joint with saline solution.  I  repaired the medial retinaculum with interrupted #1 PDS sutures and then in a pants-over- vest fashion reinforced the suture line and strengthened it.  I then flexed the knee to 120 degrees.  Patella was central and tracked normally.  We finished the retinacular repair with the PDS.  Tourniquet was released with a total time of 24 minutes.  Minor bleeding was stopped with electrocautery.  Subcu was then closed with interrupted 2-0 Vicryl and skin closed with staples.  A total of 20 mL of 0.25% Marcaine was injected into the subcu tissues prior to closure.  Once closed, the incisions cleaned and dried, bulky sterile dressing applied.  The drains were hooked to suction.  He was placed into a knee immobilizer, awakened, and transported to recovery in a stable condition.     Gaynelle Arabian, M.D.     FA/MEDQ  D:  11/13/2016  T:  11/14/2016  Job:  [id number]

## 2016-11-14 NOTE — Discharge Summary (Signed)
Physician Discharge Summary   Patient ID: Jeremy Webb MRN: [id number] DOB/AGE: [birth year] 60 y.o.  Admit date: 11/13/2016 Discharge date: 11/15/2016 Primary Diagnosis:   Patellar instability Right knee   Admission Diagnoses:  Past Medical History:  Diagnosis Date  . Anemia    hx of  . Arthritis   . Dysrhythmia    atrial fib  . GERD (gastroesophageal reflux disease)   . History of kidney stones    Discharge Diagnoses:   Active Problems:   Patellar instability of right knee  Procedure:  Procedure(s) (LRB): Right Knee Patellar Realignment (Right)   Consults: None  HPI: Patient presents with knee pain involving the  right knee. Onset of the symptoms was several months ago. Inciting event: He had a revision right TKA performed 01/24/16 and did well initially but after a few months developed lateral and anterior knee pain, There was concern over injury to the medial retinaculum early post-op but he did not have pain at that time. The pain has progressed and he has developed patellar maltracking. He presents now for patellar realignment.    Laboratory Data: Hospital Outpatient Visit on 11/08/2016  Component Date Value Ref Range Status  . Sodium 11/08/2016 137  135 - 145 mmol/L Final  . Potassium 11/08/2016 4.0  3.5 - 5.1 mmol/L Final  . Chloride 11/08/2016 105  101 - 111 mmol/L Final  . CO2 11/08/2016 26  22 - 32 mmol/L Final  . Glucose, Bld 11/08/2016 118* 65 - 99 mg/dL Final  . BUN 11/08/2016 14  6 - 20 mg/dL Final  . Creatinine, Ser 11/08/2016 0.95  0.61 - 1.24 mg/dL Final  . Calcium 11/08/2016 9.0  8.9 - 10.3 mg/dL Final  . GFR calc non Af Amer 11/08/2016 >60  >60 mL/min Final  . GFR calc Af Amer 11/08/2016 >60  >60 mL/min Final   Comment: (NOTE) The eGFR has been calculated using the CKD EPI equation. This calculation has not been validated in all clinical situations. eGFR's persistently <60 mL/min signify possible Chronic Kidney Disease.   . Anion gap  11/08/2016 6  5 - 15 Final  . WBC 11/08/2016 4.4  4.0 - 10.5 K/uL Final  . RBC 11/08/2016 4.65  4.22 - 5.81 MIL/uL Final  . Hemoglobin 11/08/2016 13.6  13.0 - 17.0 g/dL Final  . HCT 11/08/2016 39.7  39.0 - 52.0 % Final  . MCV 11/08/2016 85.4  78.0 - 100.0 fL Final  . MCH 11/08/2016 29.2  26.0 - 34.0 pg Final  . MCHC 11/08/2016 34.3  30.0 - 36.0 g/dL Final  . RDW 11/08/2016 13.5  11.5 - 15.5 % Final  . Platelets 11/08/2016 141* 150 - 400 K/uL Final   No results for input(s): HGB in the last 72 hours. No results for input(s): WBC, RBC, HCT, PLT in the last 72 hours. No results for input(s): NA, K, CL, CO2, BUN, CREATININE, GLUCOSE, CALCIUM in the last 72 hours. No results for input(s): LABPT, INR in the last 72 hours.  X-Rays:No results found.  EKG:No orders found for this or any previous visit.   Hospital Course: Patient was admitted to [facility] and taken to the OR and underwent the above state procedure without complications.  Patient tolerated the procedure well and was later transferred to the recovery room and then to the orthopaedic floor for postoperative care.  They were given PO and IV analgesics for pain control following their surgery.  They were given 24 hours of postoperative antibiotics.  PT was consulted postop to assist with mobility and transfers.  Placed into a knee immobilizer.  The patient was allowed to be WBAT with therapy. Discharge planning was consulted to help with postop disposition and equipment needs.  Patient had a good night on the evening of surgery and started to get up OOB with therapy on day one. Patient was seen in rounds and was ready to go home on day one.  They were given discharge instructions and dressing directions.  They were instructed on when to follow up in the office with Dr. Wynelle Link in one week. Unfortunately, the patient continued to have bloody drainage from the incision throughout the day on POD 1 requiring several dressing changes.   He has kept and seen again on POD 2 by Dr. Wynelle Link.  A  Bedside aspiration procedure was performed by Dr. Wynelle Link to decompress the knee. He was feeling better and then ready to go home later that same day on POD 2.  Discharge home Diet - Cardiac diet Follow up - in 1 week, next Thursday pm 11/21/16 Activity - WBAT Disposition - Home Condition Upon Discharge - Good D/C Meds - See DC Summary DVT Prophylaxis - Aspirin daily for three weeks.  Discharge Instructions    Call MD / Call 911    Complete by:  As directed    If you experience chest pain or shortness of breath, CALL 911 and be transported to the hospital emergency room.  If you develope a fever above 101 F, pus (white drainage) or increased drainage or redness at the wound, or calf pain, call your surgeon's office.   Change dressing    Complete by:  As directed    Change dressing daily with sterile 4 x 4 inch gauze dressing and apply TED hose. Do not submerge the incision under water.   Constipation Prevention    Complete by:  As directed    Drink plenty of fluids.  Prune juice may be helpful.  You may use a stool softener, such as Colace (over the counter) 100 mg twice a day.  Use MiraLax (over the counter) for constipation as needed.   Diet - low sodium heart healthy    Complete by:  As directed    Discharge instructions    Complete by:  As directed    Pick up stool softner and laxative for home use following surgery while on pain medications. Do not submerge incision under water. Please use good hand washing techniques while changing dressing each day. May shower starting three days after surgery. Please use a clean towel to pat the incision dry following showers. Continue to use ice for pain and swelling after surgery. Do not use any lotions or creams on the incision until instructed by your surgeon.   Postoperative Constipation Protocol  Constipation - defined medically as fewer than three stools per week and severe  constipation as less than one stool per week.  One of the most common issues patients have following surgery is constipation.  Even if you have a regular bowel pattern at home, your normal regimen is likely to be disrupted due to multiple reasons following surgery.  Combination of anesthesia, postoperative narcotics, change in appetite and fluid intake all can affect your bowels.  In order to avoid complications following surgery, here are some recommendations in order to help you during your recovery period.  Colace (docusate) - Pick up an over-the-counter form of Colace or another stool softener and take twice a day  as long as you are requiring postoperative pain medications.  Take with a full glass of water daily.  If you experience loose stools or diarrhea, hold the colace until you stool forms back up.  If your symptoms do not get better within 1 week or if they get worse, check with your doctor.  Dulcolax (bisacodyl) - Pick up over-the-counter and take as directed by the product packaging as needed to assist with the movement of your bowels.  Take with a full glass of water.  Use this product as needed if not relieved by Colace only.   MiraLax (polyethylene glycol) - Pick up over-the-counter to have on hand.  MiraLax is a solution that will increase the amount of water in your bowels to assist with bowel movements.  Take as directed and can mix with a glass of water, juice, soda, coffee, or tea.  Take if you go more than two days without a movement. Do not use MiraLax more than once per day. Call your doctor if you are still constipated or irregular after using this medication for 7 days in a row.  If you continue to have problems with postoperative constipation, please contact the office for further assistance and recommendations.  If you experience "the worst abdominal pain ever" or develop nausea or vomiting, please contact the office immediatly for further recommendations for  treatment.   KNEE IMMOBILIZER AT ALL TIMES. NO ROM RIGHT KNEE  Take a full dose 325 mg Aspirin daily for three weeks, than can discontinue the Aspirin.   Do not sit on low chairs, stoools or toilet seats, as it may be difficult to get up from low surfaces    Complete by:  As directed    Driving restrictions    Complete by:  As directed    No driving until released by the physician.   Increase activity slowly as tolerated    Complete by:  As directed    KNEE IMMOBILIZER AT ALL TIMES. NO ROM RIGHT KNEE   Lifting restrictions    Complete by:  As directed    No lifting until released by the physician.   Patient may shower    Complete by:  As directed    You may shower without a dressing once there is no drainage.  Do not wash over the wound.  If drainage remains, do not shower until drainage stops.   TED hose    Complete by:  As directed    Use stockings (TED hose) for 3 weeks on both leg(s).  You may remove them at night for sleeping.   Weight bearing as tolerated    Complete by:  As directed    Laterality:  right   Extremity:  Lower       Medication List    STOP taking these medications   GOODYS EXTRA STRENGTH 500-325-65 MG Pack Generic drug:  Aspirin-Acetaminophen-Caffeine     TAKE these medications   ALPRAZolam 0.25 MG tablet Commonly known as:  XANAX TAKE ONE TABLET (0.25 MG) BY MOUTH AT BEDTIME AS NEEDED FOR SLEEP/SPASMS   aspirin EC 325 MG tablet Take 1 tablet (325 mg total) by mouth daily. Take a full dose Aspirin daily for three weeks, then can discontinue the Aspirin.   methocarbamol 500 MG tablet Commonly known as:  ROBAXIN Take 1 tablet (500 mg total) by mouth every 6 (six) hours as needed for muscle spasms.   oxyCODONE 5 MG immediate release tablet Commonly known as:  Oxy IR/ROXICODONE Take  1-4 tablets (5-20 mg total) by mouth every 3 (three) hours as needed for moderate pain or severe pain.   pantoprazole 40 MG tablet Commonly known as:  PROTONIX Take  40 mg by mouth daily as needed (for acid reflux.).   rOPINIRole 0.25 MG tablet Commonly known as:  REQUIP Take 0.25-0.5 mg by mouth 3 (three) times daily. 0.25 mg in the morning, 0.25 mg in the afternoon, and 0.5 mg at bedtime.   traMADol 50 MG tablet Commonly known as:  ULTRAM Take 1-2 tablets (50-100 mg total) by mouth every 6 (six) hours as needed (mild pain).      Follow-up Information    Gearlean Alf, MD. Schedule an appointment as soon as possible for a visit on 11/21/2016.   Specialty:  Orthopedic Surgery Why:  Call office for appointment for next Thursday 12/21 with Dr. Wynelle Link. Contact information: [address] [place] [address] [phone]           Signed: Arlee Muslim, PA-C Orthopaedic Surgery 11/14/2016, 8:04 AM

## 2016-11-14 NOTE — Progress Notes (Signed)
Patient was to be discharged home this afternoon, when incision had two compression wraps, it had a small mount of drainage. When I changed the dressing and it had one compression wrap, incision began bleeding again with a moderate amount of blood. Spoke with Barron Alvine who said for patient to stay overnight. Reinforced dressing again with minimal draining now. Will continue to monitor.

## 2016-11-14 NOTE — Discharge Instructions (Signed)
KNEE IMMOBILIZER AT ALL TIMES. NO ROM RIGHT KNEE  Pick up stool softner and laxative for home. Do not submerge incision under water. May shower after three days following surgery. Continue to use ice for pain and swelling from surgery.  Take a full dose 325 mg Aspirin daily for three weeks, then can discontinue the Aspirin.

## 2016-11-15 DIAGNOSIS — M2351 Chronic instability of knee, right knee: Secondary | ICD-10-CM | POA: Diagnosis not present

## 2016-11-15 NOTE — Progress Notes (Signed)
   Subjective: 2 Days Post-Op Procedure(s) (LRB): Right Knee Patellar Realignment (Right) Patient reports pain as mild.   Patient seen in rounds with Dr. Wynelle Link. Patient is well, but has had some minor complaints of pain in the knee, requiring pain medications Patient is ready to go home today.  Objective: Vital signs in last 24 hours: Temp:  [97.4 F (36.3 C)-98.7 F (37.1 C)] 98.6 F (37 C) (12/15 0539) Pulse Rate:  [64-76] 70 (12/15 0539) Resp:  [16-18] 18 (12/15 0539) BP: (115-135)/(60-71) 115/66 (12/15 0539) SpO2:  [96 %-97 %] 96 % (12/15 0539)  Intake/Output from previous day:  Intake/Output Summary (Last 24 hours) at 11/15/16 0908 Last data filed at 11/15/16 0715  Gross per 24 hour  Intake              990 ml  Output             1750 ml  Net             -760 ml    Intake/Output this shift: Total I/O In: -  Out: 300 [Urine:300]  Labs: No results for input(s): HGB in the last 72 hours. No results for input(s): WBC, RBC, HCT, PLT in the last 72 hours. No results for input(s): NA, K, CL, CO2, BUN, CREATININE, GLUCOSE, CALCIUM in the last 72 hours. No results for input(s): LABPT, INR in the last 72 hours.  EXAM: General - Patient is Alert, Appropriate and Oriented Extremity - Neurovascular intact Sensation intact distally Dorsiflexion/Plantar flexion intact Incision - continued with some bloody drainage from the incision. Continued soft tissue, sub-Q hematoma Motor Function - intact, moving foot and toes well on exam.   Procedures Aspiration/Injection Procedure Note YUSIF SWAGERTY RN:[id number] 02/16/1956  Procedure: Aspiration Indications: Postoperative Sub-Q Hematoma  Procedure Details Consent: Risks of procedure as well as the alternatives and risks of each were explained to the (patient/caregiver).  Consent for procedure obtained. Time Out: Verified patient identification, verified procedure, site/side was marked, verified correct patient position,  special equipment/implants available, medications/allergies/relevent history reviewed, required imaging and test results available.  Performed   Local Anesthesia Used:Lidocaine 1% plain; 2mL Amount of Fluid Aspirated: 159mL Character of Fluid: bloody  A sterile dressing was applied.  Patient did tolerate procedure well. Estimated blood loss: no acute blood loss but the sub-q hematoma blood was aspirated successfully Bedside Procedure was performed by Dr. Caralee Ates PERKINS 11/15/2016, 9:14 AM   Assessment/Plan: 2 Days Post-Op Procedure(s) (LRB): Right Knee Patellar Realignment (Right) Procedure(s) (LRB): Right Knee Patellar Realignment (Right) Past Medical History:  Diagnosis Date  . Anemia    hx of  . Arthritis   . Dysrhythmia    atrial fib  . GERD (gastroesophageal reflux disease)   . History of kidney stones    Active Problems:   Patellar instability of right knee  Estimated body mass index is 35.37 kg/m as calculated from the following:   Height as of this encounter: 6\' 1"  (1.854 m).   Weight as of this encounter: 121.6 kg (268 lb 1.3 oz). Discharge home Diet - Cardiac diet Follow up - in 1 week, Next Thursday pm 11/21/2016 Activity - WBAT, Knee Immobilizer at all times, NO ROM at this time. Disposition - Home Condition Upon Discharge - Stable D/C Meds - See DC Summary DVT Prophylaxis - Aspirin for three weeks.  Arlee Muslim, PA-C Orthopaedic Surgery 11/15/2016, 9:08 AM

## 2016-11-15 NOTE — Care Management Note (Signed)
Case Management Note  Patient Details  Name: Jeremy Webb MRN: [id number] Date of Birth: 1956/05/29  Subjective/Objective:    60 y.o. M s/p Patellar Revision R TKA 11/13/2016. PT evaluation recommending No HH followup. Pt has assistance of girlfriend at home and tells me he needs no DME. Developed postop Hematoma which required +1d. Acknowledges he must wear KI at all x's. No further CM needs at this time.                 Action/Plan:CM will sign off for now but will be available should additional discharge needs arise or disposition change.    Expected Discharge Date:  11/14/16               Expected Discharge Plan:  Home/Self Care  In-House Referral:  NA  Discharge planning Services  CM Consult  Post Acute Care Choice:  NA Choice offered to:  Patient  DME Arranged:  N/A DME Agency:  NA  HH Arranged:  NA HH Agency:  NA  Status of Service:  Completed, signed off  If discussed at [place] of Stay Meetings, dates discussed:    Additional Comments:  Delrae Sawyers, RN 11/15/2016, 9:28 AM

## 2017-11-25 IMAGING — NM NM BONE 3 PHASE
10 series · 20 of 20 positions shown · non-contrast
Comparison: None.

CLINICAL DATA: Swelling and pain in right knee after arthroplasty
in 0319.

EXAM:
NUCLEAR MEDICINE 3-PHASE BONE SCAN
TECHNIQUE: Radionuclide angiographic images, immediate static blood pool
images, and 3-hour delayed static images were obtained of the knees
after intravenous injection of radiopharmaceutical.
RADIOPHARMACEUTICALS:  22.3 mCi Ac-TTm MDP

[Series 1: delays · 2.07mm/px · 1 of 1 slices shown (1 of 4)]
[im 1/1]
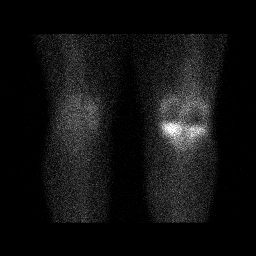

[Series 1: flow · 4.14mm/px · 6 of 39 frames shown (1 of 2)]
[frame 4/39]
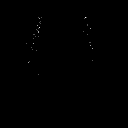
[frame 10/39  full-range]
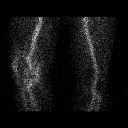
[frame 17/39  full-range]
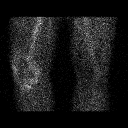
[frame 23/39  full-range]
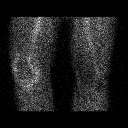
[frame 30/39  full-range]
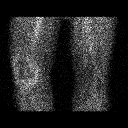
[frame 36/39  full-range]
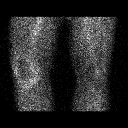

[Series 1: flow · 4.14mm/px · 6 of 40 frames shown (2 of 2)]
[frame 4/40]
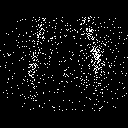
[frame 10/40  full-range]
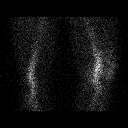
[frame 17/40  full-range]
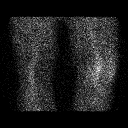
[frame 24/40  full-range]
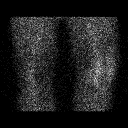
[frame 30/40  full-range]
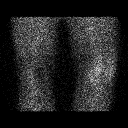
[frame 37/40  full-range]
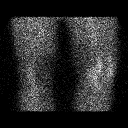

[Series 1: delays · 2.07mm/px · 1 of 1 slices shown (2 of 4)]
[im 1/1]
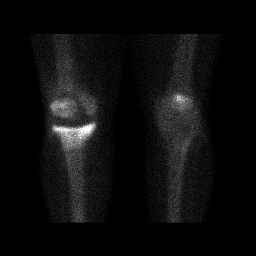

[Series 2: delays · 2.07mm/px · 1 of 1 slices shown (3 of 4)]
[im 1/1]
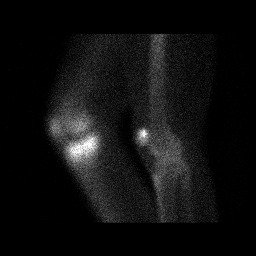

[Series 2: delays · 2.07mm/px · 1 of 1 slices shown (4 of 4)]
[im 1/1]
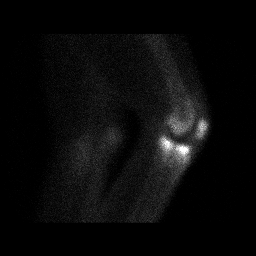

[Series 2: blood pool · 2.07mm/px · 1 of 1 slices shown (1 of 4)]
[im 1/1]
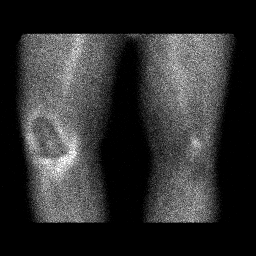

[Series 2: blood pool · 2.07mm/px · 1 of 1 slices shown (2 of 4)]
[im 1/1  full-range]
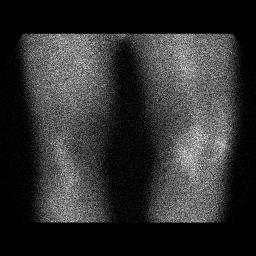

[Series 3: blood pool · 2.07mm/px · 1 of 1 slices shown (3 of 4)]
[im 1/1]
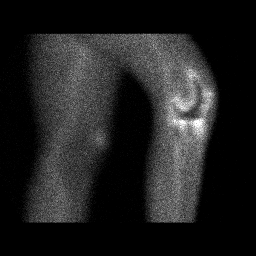

[Series 3: blood pool · 2.07mm/px · 1 of 1 slices shown (4 of 4)]
[im 1/1]
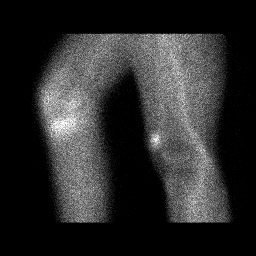

[20 of 20 positions shown; findings below may reference images not displayed]

FINDINGS: Vascular phase: Asymmetric increased perfusion to the right knee is
identified.

Blood pool phase: There is asymmetric blood pool activity
surrounding the femoral and tibial components of the right knee
arthroplasty device.

Delayed phase: There is asymmetric increased uptake surrounding the
tibial component of the right knee arthroplasty device.
IMPRESSION: 1. Increased uptake on all 3 phases localizing to the right knee is
identified compatible with arthroplasty device loosening or
infection.

## 2018-01-25 ENCOUNTER — Ambulatory Visit: Payer: Self-pay | Admitting: Orthopedic Surgery

## 2018-04-22 ENCOUNTER — Inpatient Hospital Stay: Admit: 2018-04-22 | Payer: BLUE CROSS/BLUE SHIELD | Admitting: Orthopedic Surgery

## 2018-04-22 SURGERY — TOTAL KNEE REVISION
Anesthesia: Choice | Site: Knee | Laterality: Right

## 2020-03-14 ENCOUNTER — Encounter: Payer: Self-pay | Admitting: Dermatology

## 2020-03-14 ENCOUNTER — Ambulatory Visit: Payer: Medicare Other | Admitting: Dermatology

## 2020-03-14 ENCOUNTER — Other Ambulatory Visit: Payer: Self-pay

## 2020-03-14 DIAGNOSIS — L304 Erythema intertrigo: Secondary | ICD-10-CM

## 2020-03-14 MED ORDER — FLUCONAZOLE 100 MG PO TABS: 100.0000 mg | ORAL_TABLET | Freq: Every day | ORAL | 0 refills | Status: DC

## 2020-03-14 NOTE — Patient Instructions (Signed)
Return visit for Jeremy Webb whose intertrigo of his groin has gotten progressively worse.  Lotrimin was of no value for the groin, but the rash on the right hip did clear.  Examination showed intense micropapular erythema with no definite pustules or vesicles or necrosis.  His feet were completely clear of tinea.  The differential includes the common irritant intertrigo versus Candida versus tinea versus other.  He will do either a soft compress or soak daily for 10 to 10minutes with cool or tepid water followed by application of triple paste A/F once daily and will take oral fluconazole 100 mg 1 daily for 14 days.  He understands that the effects of this medication typically appear 1 to 2 weeks after he finishes.  He was warned about the rare idiosyncratic reaction with skin rash or liver changes.  Routine follow-up scheduled in 3 to 4 weeks, but [name] can cancel this if the rash is clearing.

## 2020-03-18 ENCOUNTER — Encounter: Payer: Self-pay | Admitting: Dermatology

## 2020-03-18 NOTE — Progress Notes (Signed)
   Follow-Up Visit   Subjective  Jeremy Webb is a 64 y.o. male who presents for the following: Rash (groin area for 1 week- using lotrmin -rash getting worse).  rash Location: Groin Duration: Months  Quality: Worse Associated Signs/Symptoms: Itch Modifying Factors: Chlortrimazole Severity:  Timing: Context:   The following portions of the chart were reviewed this encounter and updated as appropriate:     Objective  Well appearing patient in no apparent distress; mood and affect are within normal limits.  A focused examination was performed including abdomen, groin, legs, feet.. Relevant physical exam findings are noted in the Assessment and Plan. Return visit for Jeremy Webb whose intertrigo of his groin has gotten progressively worse.  Lotrimin was of no value for the groin, but the rash on the right hip did clear.  Examination showed intense micropapular erythema with no definite pustules or vesicles or necrosis.  His feet were completely clear of tinea.  The differential includes the common irritant intertrigo versus Candida versus tinea versus other.  He will do either a soft compress or soak daily for 10 to 22minutes with cool or tepid water followed by application of triple paste A/F once daily and will take oral fluconazole 100 mg 1 daily for 14 days.  He understands that the effects of this medication typically appear 1 to 2 weeks after he finishes.  He was warned about the rare idiosyncratic reaction with skin rash or liver changes.  Routine follow-up scheduled in 3 to 4 weeks, but Jeremy Webb can cancel this if the rash is clearing.  Assessment & Plan  Erythema intertrigo (2) Pubic; Left Inguinal Area  Ordered Medications: fluconazole (DIFLUCAN) 100 MG tablet

## 2020-11-16 ENCOUNTER — Other Ambulatory Visit: Payer: Self-pay | Admitting: Urology

## 2020-11-22 ENCOUNTER — Encounter (HOSPITAL_BASED_OUTPATIENT_CLINIC_OR_DEPARTMENT_OTHER): Payer: Self-pay | Admitting: Urology

## 2020-11-22 ENCOUNTER — Other Ambulatory Visit: Payer: Self-pay

## 2020-11-22 NOTE — Progress Notes (Addendum)
ADDENDUM:   Called and spoke w/ pt via phone please arrive at 0600, not 0630,  Be sure to quarantine.  Pt verbalized understanding.   ADDENDUM:  Received pt's 12 lead ekg tracing via fax, place in chart.   Spoke w/ via phone for pre-op interview--- PT Lab needs dos----  no             Lab results------current ekg dated 05-09-2020 in care everywhere, requested 12 lead tracing COVID test ------ 11-23-2020 @ 1020 Arrive at ------- 0630 NPO after MN NO Solid Food.  Clear liquids from MN until--- 0530 Medications to take morning of surgery ----- Protonix Diabetic medication ----- n/a Patient Special Instructions ----- n/a Pre-Op special Istructions ----- per pt has right knee post TKA instability of prosthesis, stated only using cane at times, stated should not be any issue to be positioned in stir-ups for surgery.   Patient verbalized understanding of instructions that were given at this phone interview. Patient denies shortness of breath, chest pain, fever, cough at this phone interview.   Anesthesia Review:  PAF, per pt has had this since child, never been on medication/ blood thinner , or had intervention.  Stated had mainly been followed by pcp, last seen a cardiologist prior to the one on 05-09-2020 was in the 1980s.  Pt had echo and myoview done and stated no recommendation only follow up next year.   Pt denies any cardiac s&s, sob, and no peripheral swelling.  Chart to be reviewed by anesthesia.  PCP:  Dr A. Snider Cardiologist :  Dr Chauncey Cruel. [place]beck (lov 05-09-2020 care everywhere) Chest x-ray :  no EKG :  05-09-2020 care everywhere (requested 12 lead tracing to be faxed) Echo : 05-10-2020 care everywhere Stress test:  05-10-2020  Care everywhere Cardiac Cath :  no Activity level:  Denies any sob w/ any activity Sleep Study/ CPAP :  NO Fasting Blood Sugar :      / Checks Blood Sugar -- times a day:  N/A Blood Thinner/ Instructions /Last Dose: NO ASA / Instructions/ Last Dose :  NO

## 2020-11-23 ENCOUNTER — Other Ambulatory Visit (HOSPITAL_COMMUNITY)
Admission: RE | Admit: 2020-11-23 | Discharge: 2020-11-23 | Disposition: A | Discharge status: Home / Self Care | Payer: Medicare Other | Source: Ambulatory Visit | Attending: Urology | Admitting: Urology

## 2020-11-23 DIAGNOSIS — Z01812 Encounter for preprocedural laboratory examination: Secondary | ICD-10-CM | POA: Insufficient documentation

## 2020-11-23 DIAGNOSIS — Z20822 Contact with and (suspected) exposure to covid-19: Secondary | ICD-10-CM | POA: Insufficient documentation

## 2020-11-23 LAB — SARS CORONAVIRUS 2 (TAT 6-24 HRS): SARS Coronavirus 2: NEGATIVE

## 2020-11-26 NOTE — H&P (Signed)
H&P  Chief Complaint: Kidney stone  History of Present Illness: 64 yo male presents for ureteroscopic mgmt of a 9x12 mm Lt proximal ureteral stone.  Past Medical History:  Diagnosis Date  . Bicuspid aortic valve    per echo in care everywhere 04-14-2020, mild regurg. , no stenosis , mild thickened  . Chronic pain syndrome   . DDD (degenerative disc disease), lumbosacral   . GERD (gastroesophageal reflux disease)   . History of basal cell carcinoma (BCC) excision    1997 left side nose s/p excision;  right side s/p moh's 02-23-2009  . History of kidney stones   . History of nuclear stress test    05-10-2020  care everywhere,  normal w/ no evidence ischemia, nuclear ef 52%  . Insomnia   . Instability of internal right knee prosthesis [facility])    orthopedic --- dr c. Sallyanne Havers,  stated scheduled for surgery 02/ 2022  . Left ureteral stone   . Lower urinary tract symptoms (LUTS)    per pt due to kidney stone  . Neuropathy of right lower extremity    per pt due to nerve damage ,  s/p TKA w/ 3 revision's  . OA (osteoarthritis)   . PAF (paroxysmal atrial fibrillation) [facility])    cardiologist-- dr Chauncey Cruel. Rohrbeck--- per pt he has had atrial fib since childhood, never been on medicaton/ anticoagulate / and no intervention;  last seen a cardiologist prior to this is was 1980s, so mainly have been followed by pcp;      Past Surgical History:  Procedure Laterality Date  . ELBOW ARTHROSCOPY Right 2000 approx.  Marland Kitchen KNEE ARTHROSCOPY Right 2010;  04-15-2014  . MOHS SURGERY  02/23/2009   right side nose  . NASAL FRACTURE SURGERY  age 42  . PATELLAR REEFING Right 11/13/2016   Procedure: Right Knee Patellar Realignment;  Surgeon: Gaynelle Arabian, MD;  Location: [facility];  Service: Orthopedics;  Laterality: Right;  . REVISION TOTAL KNEE ARTHROPLASTY Right 04-07-2018;  01-31-2020  . SHOULDER ARTHROSCOPY Bilateral x2 last one 2005  approx.;  x1  right  2005  . TONSILLECTOMY  child  . TOTAL KNEE ARTHROPLASTY Right  right 09-22-2014   post op infection s/p 09-25-2014 I&D w/ poly exchange  . TOTAL KNEE REVISION Right 01/24/2016   Procedure: RIGHT TOTAL KNEE REVISION ;  Surgeon: Gaynelle Arabian, MD;  Location: [facility];  Service: Orthopedics;  Laterality: Right;    Home Medications:  Allergies as of 11/26/2020      Reactions   Iodinated Diagnostic Agents Other (See Comments)   Skin burning AND PHLEBITIS   Penicillins Other (See Comments)   Childhood reaction Has patient had a PCN reaction causing immediate rash, facial/tongue/throat swell ing, SOB or lightheadedness with hypotension:unsure Has patient had a PCN reaction causing severe rash involving mucus membranes or skin necrosis:unsure Has patient had a PCN reaction that required hospitalization:No Has patient had a PCN reaction occurring within the last 10 years:No If all of the above answers are "NO", then may proceed with Cephalosporin use.      Medication List    Notice   Cannot display discharge medications because the patient has not yet been admitted.     Allergies:  Allergies  Allergen Reactions  . Iodinated Diagnostic Agents Other (See Comments)    Skin burning AND PHLEBITIS  . Penicillins Other (See Comments)    Childhood reaction Has patient had a PCN reaction causing immediate rash, facial/tongue/throat swell ing, SOB or lightheadedness with hypotension:unsure Has  patient had a PCN reaction causing severe rash involving mucus membranes or skin necrosis:unsure Has patient had a PCN reaction that required hospitalization:No Has patient had a PCN reaction occurring within the last 10 years:No If all of the above answers are "NO", then may proceed with Cephalosporin use.     History reviewed. No pertinent family history.  Social History:  reports that he has never smoked. He has never used smokeless tobacco. He reports current alcohol use. He reports that he does not use drugs.  ROS: A complete review of systems was performed.   All systems are negative except for pertinent findings as noted.  Physical Exam:  Vital signs in last 24 hours: Ht 6' (1.829 m)   Wt 117 kg   BMI 34.99 kg/m  Constitutional:  Alert and oriented, No acute distress Cardiovascular: Regular rate  Respiratory: Normal respiratory effort GI: Abdomen is soft, nontender, nondistended, no abdominal masses. No CVAT.  Genitourinary: Normal male phallus, testes are descended bilaterally and non-tender and without masses, scrotum is normal in appearance without lesions or masses, perineum is normal on inspection. Lymphatic: No lymphadenopathy Neurologic: Grossly intact, no focal deficits Psychiatric: Normal mood and affect  Laboratory Data:  No results for input(s): WBC, HGB, HCT, PLT in the last 72 hours.  No results for input(s): NA, K, CL, GLUCOSE, BUN, CALCIUM, CREATININE in the last 72 hours.  Invalid input(s): CO3   No results found for this or any previous visit (from the past 24 hour(s)). Recent Results (from the past 240 hour(s))  SARS CORONAVIRUS 2 (TAT 6-24 HRS) Nasopharyngeal Nasopharyngeal Swab     Status: None   Collection Time: 11/23/20  3:23 PM   Specimen: Nasopharyngeal Swab  Result Value Ref Range Status   SARS Coronavirus 2 NEGATIVE NEGATIVE Final    Comment: (NOTE) SARS-CoV-2 target nucleic acids are NOT DETECTED.  The SARS-CoV-2 RNA is generally detectable in upper and lower respiratory specimens during the acute phase of infection. Negative results do not preclude SARS-CoV-2 infection, do not rule out co-infections with other pathogens, and should not be used as the sole basis for treatment or other patient management decisions. Negative results must be combined with clinical observations, patient history, and epidemiological information. The expected result is Negative.  Fact Sheet for Patients: HairSlick.no  Fact Sheet for Healthcare  Providers: quierodirigir.com  This test is not yet approved or cleared by the [place] FDA and  has been authorized for detection and/or diagnosis of SARS-CoV-2 by FDA under an Emergency Use Authorization (EUA). This EUA will remain  in effect (meaning this test can be used) for the duration of the COVID-19 declaration under Se ction 564(b)(1) of the Act, 21 U.S.C. section 360bbb-3(b)(1), unless the authorization is terminated or revoked sooner.  Performed at [facility], [address], [place], [place] [address]     Renal Function: No results for input(s): CREATININE in the last 168 hours. CrCl cannot be calculated (Patient's most recent lab result is older than the maximum 21 days allowed.).  Radiologic Imaging: No results found.  Impression/Assessment:  Lt proximal ureteral stone  Plan:  Cystoscopy, Lt RGP, Lt URS, HLL, stone extraction, J2 stent

## 2020-11-27 ENCOUNTER — Ambulatory Visit (HOSPITAL_BASED_OUTPATIENT_CLINIC_OR_DEPARTMENT_OTHER): Payer: Medicare Other | Admitting: Anesthesiology

## 2020-11-27 ENCOUNTER — Encounter (HOSPITAL_BASED_OUTPATIENT_CLINIC_OR_DEPARTMENT_OTHER)
Admission: RE | Disposition: A | Discharge status: Home / Self Care | Payer: Self-pay | Source: Home / Self Care | Attending: Urology

## 2020-11-27 ENCOUNTER — Encounter (HOSPITAL_BASED_OUTPATIENT_CLINIC_OR_DEPARTMENT_OTHER): Payer: Self-pay | Admitting: Urology

## 2020-11-27 ENCOUNTER — Other Ambulatory Visit: Payer: Self-pay

## 2020-11-27 ENCOUNTER — Ambulatory Visit (HOSPITAL_BASED_OUTPATIENT_CLINIC_OR_DEPARTMENT_OTHER)
Admission: RE | Admit: 2020-11-27 | Discharge: 2020-11-27 | Disposition: A | Discharge status: Home / Self Care | Payer: Medicare Other | Attending: Urology | Admitting: Urology

## 2020-11-27 DIAGNOSIS — I480 Paroxysmal atrial fibrillation: Secondary | ICD-10-CM | POA: Diagnosis not present

## 2020-11-27 DIAGNOSIS — N132 Hydronephrosis with renal and ureteral calculous obstruction: Secondary | ICD-10-CM | POA: Diagnosis not present

## 2020-11-27 DIAGNOSIS — Z880 Allergy status to penicillin: Secondary | ICD-10-CM | POA: Insufficient documentation

## 2020-11-27 DIAGNOSIS — Z96651 Presence of right artificial knee joint: Secondary | ICD-10-CM | POA: Insufficient documentation

## 2020-11-27 DIAGNOSIS — Z87442 Personal history of urinary calculi: Secondary | ICD-10-CM | POA: Diagnosis not present

## 2020-11-27 DIAGNOSIS — Z91041 Radiographic dye allergy status: Secondary | ICD-10-CM | POA: Diagnosis not present

## 2020-11-27 DIAGNOSIS — G894 Chronic pain syndrome: Secondary | ICD-10-CM | POA: Diagnosis not present

## 2020-11-27 DIAGNOSIS — N202 Calculus of kidney with calculus of ureter: Secondary | ICD-10-CM | POA: Diagnosis present

## 2020-11-27 HISTORY — DX: Other specified postprocedural states: Z98.890

## 2020-11-27 HISTORY — PX: CYSTOSCOPY/URETEROSCOPY/HOLMIUM LASER/STENT PLACEMENT: SHX6546

## 2020-11-27 HISTORY — DX: Bicuspid aortic valve: Q23.81

## 2020-11-27 HISTORY — DX: Chronic pain syndrome: G89.4

## 2020-11-27 HISTORY — DX: Personal history of other medical treatment: Z92.89

## 2020-11-27 HISTORY — DX: Paroxysmal atrial fibrillation: I48.0

## 2020-11-27 HISTORY — DX: Congenital insufficiency of aortic valve: Q23.1

## 2020-11-27 HISTORY — DX: Other intervertebral disc degeneration, lumbosacral region: M51.37

## 2020-11-27 HISTORY — DX: Instability of internal right knee prosthesis, initial encounter: T84.022A

## 2020-11-27 HISTORY — DX: Other specified postprocedural states: Z85.828

## 2020-11-27 HISTORY — DX: Unspecified mononeuropathy of right lower limb: G57.91

## 2020-11-27 HISTORY — DX: Unspecified osteoarthritis, unspecified site: M19.90

## 2020-11-27 HISTORY — DX: Insomnia, unspecified: G47.00

## 2020-11-27 HISTORY — DX: Other intervertebral disc degeneration, lumbosacral region without mention of lumbar back pain or lower extremity pain: M51.379

## 2020-11-27 HISTORY — DX: Unspecified symptoms and signs involving the genitourinary system: R39.9

## 2020-11-27 HISTORY — DX: Calculus of ureter: N20.1

## 2020-11-27 SURGERY — CYSTOSCOPY/URETEROSCOPY/HOLMIUM LASER/STENT PLACEMENT
Anesthesia: General | Site: Urethra

## 2020-11-27 MED ORDER — MIDAZOLAM HCL 2 MG/2ML IJ SOLN
INTRAMUSCULAR | Status: AC
  Filled 2020-11-27: qty 2

## 2020-11-27 MED ORDER — ONDANSETRON HCL 4 MG/2ML IJ SOLN
INTRAMUSCULAR | Status: AC
  Filled 2020-11-27: qty 2

## 2020-11-27 MED ORDER — DEXAMETHASONE SODIUM PHOSPHATE 10 MG/ML IJ SOLN
INTRAMUSCULAR | Status: AC
  Filled 2020-11-27: qty 1

## 2020-11-27 MED ORDER — MIDAZOLAM HCL 2 MG/2ML IJ SOLN
INTRAMUSCULAR | Status: DC | PRN
  Administered 2020-11-27: 2 mg via INTRAVENOUS

## 2020-11-27 MED ORDER — ONDANSETRON HCL 4 MG/2ML IJ SOLN
INTRAMUSCULAR | Status: DC | PRN
  Administered 2020-11-27: 4 mg via INTRAVENOUS

## 2020-11-27 MED ORDER — KETOROLAC TROMETHAMINE 30 MG/ML IJ SOLN
INTRAMUSCULAR | Status: AC
  Filled 2020-11-27: qty 1

## 2020-11-27 MED ORDER — PROPOFOL 10 MG/ML IV BOLUS
INTRAVENOUS | Status: AC
  Filled 2020-11-27: qty 20

## 2020-11-27 MED ORDER — LACTATED RINGERS IV SOLN
INTRAVENOUS | Status: DC
  Administered 2020-11-27: 1000 mL via INTRAVENOUS

## 2020-11-27 MED ORDER — WHITE PETROLATUM EX OINT
TOPICAL_OINTMENT | CUTANEOUS | Status: AC
  Filled 2020-11-27: qty 5

## 2020-11-27 MED ORDER — FENTANYL CITRATE (PF) 100 MCG/2ML IJ SOLN
INTRAMUSCULAR | Status: DC | PRN
  Administered 2020-11-27: 25 ug via INTRAVENOUS
  Administered 2020-11-27 (×2): 50 ug via INTRAVENOUS
  Administered 2020-11-27: 25 ug via INTRAVENOUS

## 2020-11-27 MED ORDER — LIDOCAINE HCL (PF) 2 % IJ SOLN
INTRAMUSCULAR | Status: AC
  Filled 2020-11-27: qty 5

## 2020-11-27 MED ORDER — IOHEXOL 300 MG/ML  SOLN
INTRAMUSCULAR | Status: DC | PRN
  Administered 2020-11-27: 20 mL via URETHRAL

## 2020-11-27 MED ORDER — FENTANYL CITRATE (PF) 100 MCG/2ML IJ SOLN: 25.0000 ug | INTRAMUSCULAR | Status: DC | PRN

## 2020-11-27 MED ORDER — CIPROFLOXACIN IN D5W 400 MG/200ML IV SOLN
400.0000 mg | INTRAVENOUS | Status: AC
  Administered 2020-11-27: 400 mg via INTRAVENOUS

## 2020-11-27 MED ORDER — SODIUM CHLORIDE 0.9 % IR SOLN
Status: DC | PRN
  Administered 2020-11-27 (×2): 3000 mL

## 2020-11-27 MED ORDER — CIPROFLOXACIN IN D5W 400 MG/200ML IV SOLN
INTRAVENOUS | Status: AC
  Filled 2020-11-27: qty 200

## 2020-11-27 MED ORDER — KETOROLAC TROMETHAMINE 30 MG/ML IJ SOLN
INTRAMUSCULAR | Status: DC | PRN
  Administered 2020-11-27: 30 mg via INTRAVENOUS

## 2020-11-27 MED ORDER — OXYBUTYNIN CHLORIDE 5 MG PO TABS: 5.0000 mg | ORAL_TABLET | Freq: Three times a day (TID) | ORAL | 1 refills | Status: AC | PRN

## 2020-11-27 MED ORDER — FENTANYL CITRATE (PF) 100 MCG/2ML IJ SOLN
INTRAMUSCULAR | Status: AC
  Filled 2020-11-27: qty 2

## 2020-11-27 MED ORDER — DEXAMETHASONE SODIUM PHOSPHATE 10 MG/ML IJ SOLN
INTRAMUSCULAR | Status: DC | PRN
  Administered 2020-11-27 (×2): 5 mg via INTRAVENOUS

## 2020-11-27 MED ORDER — EPHEDRINE SULFATE-NACL 50-0.9 MG/10ML-% IV SOSY
PREFILLED_SYRINGE | INTRAVENOUS | Status: DC | PRN
  Administered 2020-11-27: 10 mg via INTRAVENOUS

## 2020-11-27 MED ORDER — TRAMADOL HCL 50 MG PO TABS: 50.0000 mg | ORAL_TABLET | Freq: Four times a day (QID) | ORAL | 0 refills | Status: AC | PRN

## 2020-11-27 MED ORDER — LIDOCAINE 2% (20 MG/ML) 5 ML SYRINGE
INTRAMUSCULAR | Status: DC | PRN
  Administered 2020-11-27: 100 mg via INTRAVENOUS

## 2020-11-27 MED ORDER — PROPOFOL 10 MG/ML IV BOLUS
INTRAVENOUS | Status: DC | PRN
  Administered 2020-11-27: 200 mg via INTRAVENOUS

## 2020-11-27 MED ORDER — DOXYCYCLINE HYCLATE 100 MG PO TABS: 100.0000 mg | ORAL_TABLET | Freq: Two times a day (BID) | ORAL | 0 refills | Status: AC

## 2020-11-27 SURGICAL SUPPLY — 23 items
BAG DRAIN URO-CYSTO SKYTR STRL (DRAIN) ×2 IMPLANT
BAG DRN UROCATH (DRAIN) ×1
BASKET STONE 1.7 NGAGE (UROLOGICAL SUPPLIES) ×1 IMPLANT
CATH INTERMIT  6FR 70CM (CATHETERS) ×2 IMPLANT
CLOTH BEACON ORANGE TIMEOUT ST (SAFETY) ×2 IMPLANT
DRSG TEGADERM 2-3/8X2-3/4 SM (GAUZE/BANDAGES/DRESSINGS) ×1 IMPLANT
FIBER LASER FLEXIVA 365 (UROLOGICAL SUPPLIES) ×1 IMPLANT
GLOVE BIO SURGEON STRL SZ8 (GLOVE) ×2 IMPLANT
GLOVE ECLIPSE 7.5 STRL STRAW (GLOVE) ×2 IMPLANT
GOWN STRL REUS W/ TWL LRG LVL3 (GOWN DISPOSABLE) IMPLANT
GOWN STRL REUS W/ TWL XL LVL3 (GOWN DISPOSABLE) ×1 IMPLANT
GOWN STRL REUS W/TWL LRG LVL3 (GOWN DISPOSABLE) ×2
GOWN STRL REUS W/TWL XL LVL3 (GOWN DISPOSABLE) ×2
GUIDEWIRE STR DUAL SENSOR (WIRE) ×1 IMPLANT
IV NS IRRIG 3000ML ARTHROMATIC (IV SOLUTION) ×4 IMPLANT
KIT TURNOVER CYSTO (KITS) ×2 IMPLANT
MANIFOLD NEPTUNE II (INSTRUMENTS) ×2 IMPLANT
NS IRRIG 500ML POUR BTL (IV SOLUTION) ×2 IMPLANT
PACK CYSTO (CUSTOM PROCEDURE TRAY) ×2 IMPLANT
SHEATH URETERAL 12FRX35CM (MISCELLANEOUS) ×1 IMPLANT
STENT URET 6FRX28 CONTOUR (STENTS) ×1 IMPLANT
TUBE CONNECTING 12X1/4 (SUCTIONS) ×1 IMPLANT
TUBING UROLOGY SET (TUBING) ×1 IMPLANT

## 2020-11-27 NOTE — Transfer of Care (Signed)
Immediate Anesthesia Transfer of Care Note  Patient: JERID CATHERMAN  Procedure(s) Performed: Procedure(s): CYSTOSCOPY, BILATERAL RETROGRADE PYELOGRAM /LEFT URETEROSCOPY, HOLMIUM LASER AND STENT PLACEMENT  Patient Location: PACU  Anesthesia Type: General  Level of Consciousness: awake, oriented, sedated and patient cooperative  Airway & Oxygen Therapy: Patient Spontanous Breathing and Patient connected to face mask oxygen  Post-op Assessment: Report given to PACU RN and Post -op Vital signs reviewed and stable  Post vital signs: Reviewed and stable  Complications: No apparent anesthesia complications  Last Vitals:  Vitals Value Taken Time  BP 141/74 11/27/20 1000  Temp    Pulse 71 11/27/20 1002  Resp 22 11/27/20 1002  SpO2 95 % 11/27/20 1002  Vitals shown include unvalidated device data.  Last Pain:  Vitals:   11/27/20 0639  TempSrc: Oral  PainSc: 4       Patients Stated Pain Goal: 7 (11/27/20 5597)  Complications: No complications documented.

## 2020-11-27 NOTE — Discharge Instructions (Signed)
1. You may see some blood in the urine and may have some burning with urination for 48-72 hours. You also may notice that you have to urinate more frequently or urgently after your procedure which is normal.  2. You should call should you develop an inability urinate, fever > 101, persistent nausea and vomiting that prevents you from eating or drinking to stay hydrated.  3. If you have a stent, you will likely urinate more frequently and urgently until the stent is removed and you may experience some discomfort/pain in the lower abdomen and flank especially when urinating. You may take pain medication prescribed to you if needed for pain. You may also intermittently have blood in the urine until the stent is removed. OK to pul string to remove stent next Monday 4. If you have a catheter, you will be taught how to take care of the catheter by the nursing staff prior to discharge from the hospital.  You may periodically feel a strong urge to void with the catheter in place.  This is a bladder spasm and most often can occur when having a bowel movement or moving around. It is typically self-limited and usually will stop after a few minutes.  You may use some Vaseline or Neosporin around the tip of the catheter to reduce friction at the tip of the penis. You may also see some blood in the urine.  A very small amount of blood can make the urine look quite red.  As long as the catheter is draining well, there usually is not a problem.  However, if the catheter is not draining well and is bloody, you should call the office [phone]) to notify us.    Ureteral Stent Implantation, Care After This sheet gives you information about how to care for yourself after your procedure. Your health care provider may also give you more specific instructions. If you have problems or questions, contact your health care provider. What can I expect after the procedure? After the procedure, it is common to  have:  Nausea.  Mild pain when you urinate. You may feel this pain in your lower back or lower abdomen. The pain should stop within a few minutes after you urinate. This may last for up to 1 week.  A small amount of blood in your urine for several days. Follow these instructions at home: Medicines  Take over-the-counter and prescription medicines only as told by your health care provider.  If you were prescribed an antibiotic medicine, take it as told by your health care provider. Do not stop taking the antibiotic even if you start to feel better.  Do not drive for 24 hours if you were given a sedative during your procedure.  Ask your health care provider if the medicine prescribed to you requires you to avoid driving or using heavy machinery. Activity  Rest as told by your health care provider.  Avoid sitting for a long time without moving. Get up to take short walks every 1-2 hours. This is important to improve blood flow and breathing. Ask for help if you feel weak or unsteady.  Return to your normal activities as told by your health care provider. Ask your health care provider what activities are safe for you. General instructions   Watch for any blood in your urine. Call your health care provider if the amount of blood in your urine increases.  If you have a catheter: ? Follow instructions from your health care provider about taking  care of your catheter and collection bag. ? Do not take baths, swim, or use a hot tub until your health care provider approves. Ask your health care provider if you may take showers. You may only be allowed to take sponge baths.  Drink enough fluid to keep your urine pale yellow.  Do not use any products that contain nicotine or tobacco, such as cigarettes, e-cigarettes, and chewing tobacco. These can delay healing after surgery. If you need help quitting, ask your health care provider.  Keep all follow-up visits as told by your health care  provider. This is important. Contact a health care provider if:  You have pain that gets worse or does not get better with medicine, especially pain when you urinate.  You have difficulty urinating.  You feel nauseous or you vomit repeatedly during a period of more than 2 days after the procedure. Get help right away if:  Your urine is dark red or has blood clots in it.  You are leaking urine (have incontinence).  The end of the stent comes out of your urethra.  You cannot urinate.  You have sudden, sharp, or severe pain in your abdomen or lower back.  You have a fever.  You have swelling or pain in your legs.  You have difficulty breathing. Summary  After the procedure, it is common to have mild pain when you urinate that goes away within a few minutes after you urinate. This may last for up to 1 week.  Watch for any blood in your urine. Call your health care provider if the amount of blood in your urine increases.  Take over-the-counter and prescription medicines only as told by your health care provider.  Drink enough fluid to keep your urine pale yellow. This information is not intended to replace advice given to you by your health care provider. Make sure you discuss any questions you have with your health care provider. Document Revised: 08/25/2018 Document Reviewed: 08/26/2018 Elsevier Patient Education  2020 [employer].    Post Anesthesia Home Care Instructions  Activity: Get plenty of rest for the remainder of the day. A responsible individual must stay with you for 24 hours following the procedure.  For the next 24 hours, DO NOT: -Drive a car -Advertising copywriter -Drink alcoholic beverages -Take any medication unless instructed by your physician -Make any legal decisions or sign important papers.  Meals: Start with liquid foods such as gelatin or soup. Progress to regular foods as tolerated. Avoid greasy, spicy, heavy foods. If nausea and/or vomiting  occur, drink only clear liquids until the nausea and/or vomiting subsides. Call your physician if vomiting continues.  Special Instructions/Symptoms: Your throat may feel dry or sore from the anesthesia or the breathing tube placed in your throat during surgery. If this causes discomfort, gargle with warm salt water. The discomfort should disappear within 24 hours.

## 2020-11-27 NOTE — Interval H&P Note (Signed)
History and Physical Interval Note:  11/27/2020 8:27 AM  Jeremy Webb  has presented today for surgery, with the diagnosis of LEFT URETERAL STONE.  The various methods of treatment have been discussed with the patient and family. After consideration of risks, benefits and other options for treatment, the patient has consented to  Procedure(s): CYSTOSCOPY LEFT RETROGRADE PYELOGRAM /URETEROSCOPY/HOLMIUM LASER/STENT PLACEMENT (Left) as a surgical intervention.  The patient's history has been reviewed, patient examined, no change in status, stable for surgery.  I have reviewed the patient's chart and labs.  Questions were answered to the patient's satisfaction.     Lillette Boxer Caterina Racine

## 2020-11-27 NOTE — Op Note (Signed)
Preoperative diagnosis: Left ureteral calculi, right flank pain  Postoperative diagnosis: Same, no evidence of right ureteral calculi  Principal procedure: Cystoscopy, bilateral retrograde ureteral pyelograms, fluoroscopic interpretation, bilateral ureteroscopy, holmium laser lithotripsy and extraction of left ureteral calculi, placement of 6 French by 28 cm contour double-J stent on the left with tether  Surgeon: Sema Stangler  Anesthesia: General with LMA  Complications: None  Specimen: Stone fragments  Estimated blood loss: Less than 5 mL  Indications: 64 year old male with symptomatic left upper mid ureteral calculi.  Additionally, in the holding area this morning he stated that he has had some right flank pain and is worried about right sided calculi.  He presents at this time for cystoscopy, bilateral retrogrades and probable ureteroscopic management of left ureteral calculi with laser as well as stent placement.  I discussed the procedure as well as risks and complications with the patient.  These include but are not limited to infection, ureteral trauma, need for stent, stent discomfort, the need for staged procedure, anesthetic complications among others.  He understands these and desires to proceed.  Findings: There was a small filling defect on retrograde in the patient's right distal ureter.  On ureteroscopic evaluation there was no stone in the right ureter.  Left retrograde study revealed a filling defects both distally in the left ureter, in the interpolar region and at the upper left ureter.  There was moderate hydronephrosis above the mid ureter from the obstructing calculi.  Bladder was normal, ureteral orifice ease normal.  Description of procedure: The patient was properly identified and marked in the holding area.  He went to the operating room where general anesthetic was administered with LMA.  Is placed in the dorsolithotomy position, with positioning being completed before  his anesthetic.  His genitalia and perineum were prepped and draped.  Proper timeout was performed.  73 French panendoscope was advanced under direct vision into his bladder.  Bilateral retrograde ureteral pyelograms were performed using the 6 French open-ended catheter.  On the left, there was a filling defect distally, and in the proximal and mid ureter there were significant filling defects and hydroureteronephrosis.  I did not see filling defects in the pyelocalyceal system.  On the right, there was a tiny filling defect which could be a bubble or a stone.  No further filling defects were seen in the right ureter.  I then dilated the distal and mid left ureter first with the inner core and then the entire 12/14 ureteral access catheter.  I then advanced the semirigid dual-lumen ureteroscope and the small distal stone was seen, grasped and extracted into the bladder.  There were several stones in the left mid ureter.  The smaller ones were easily grasped and brought down through the ureter into the bladder.  The larger stone was fragmented with laser energy, applied with a 365 m fiber with the power settings 0.5 J and 30 Hz.  This was fragmented into smaller fragments which were then easily taken out into the bladder.  The larger proximal ureteral stone was rinsed into the renal pelvis and then the interpolar calyces.  Once this occurred, I removed the semirigid scope and placed the ureteral access catheter.  Using laser energy with the stone in the interpolar calyx, the stone was pulverized into tiny fragments with laser setting 50 Hz and 1.0 J.  Tiny fragments were resulting, these were felt to be small enough to pass.  No larger fragments were seen upon systematic inspection of the pyelocalyceal system.  At this point, the ureteroscope and ureteral access catheter were removed.  I then replaced the guidewire and placed a 28 cm contour double-J stent, 6 [place].  Excellent proximal distal curls were seen  using fluoroscopy and cystoscopy, respectively.  The tether was left on the distal end and brought through the urethra.  I then replaced the cystoscope and rinse the stone fragments from the bladder.  These were saved for specimen.  The cystoscope was then removed.  I then utilized the ureteroscope to advance into the right distal and mid ureter.  No stones were seen at this point.  At this point, the procedure was terminated.  The patient was awakened after his bladder was drained.  The tether was taped to his penis.  He was then taken to the PACU in stable condition, having tolerated the procedure well.

## 2020-11-27 NOTE — Anesthesia Postprocedure Evaluation (Signed)
Anesthesia Post Note  Patient: PRINCESTON BLIZZARD  Procedure(s) Performed: CYSTOSCOPY, BILATERAL RETROGRADE PYELOGRAM /LEFT URETEROSCOPY, HOLMIUM LASER AND STENT PLACEMENT (Urethra)     Patient location during evaluation: PACU Anesthesia Type: General Level of consciousness: awake and alert Pain management: pain level controlled Vital Signs Assessment: post-procedure vital signs reviewed and stable Respiratory status: spontaneous breathing, nonlabored ventilation, respiratory function stable and patient connected to nasal cannula oxygen Cardiovascular status: blood pressure returned to baseline and stable Postop Assessment: no apparent nausea or vomiting Anesthetic complications: no   No complications documented.  Last Vitals:  Vitals:   11/27/20 1030 11/27/20 1115  BP: (!) 144/80 140/82  Pulse: 67 63  Resp: 12 16  Temp: 36.7 C 36.6 C  SpO2: 96% 94%    Last Pain:  Vitals:   11/27/20 1115  TempSrc:   PainSc: 4                  Rashay Barnette L Eliab Closson

## 2020-11-27 NOTE — Anesthesia Procedure Notes (Signed)
Procedure Name: LMA Insertion Date/Time: 11/27/2020 8:47 AM Performed by: Suan Halter, CRNA Pre-anesthesia Checklist: Patient identified, Emergency Drugs available, Suction available and Patient being monitored Patient Re-evaluated:Patient Re-evaluated prior to induction Oxygen Delivery Method: Circle system utilized Preoxygenation: Pre-oxygenation with 100% oxygen Induction Type: IV induction Ventilation: Mask ventilation without difficulty LMA: LMA inserted LMA Size: 4.0 Number of attempts: 1 Airway Equipment and Method: Bite block Placement Confirmation: positive ETCO2 Tube secured with: Tape Dental Injury: Teeth and Oropharynx as per pre-operative assessment

## 2020-11-27 NOTE — Anesthesia Preprocedure Evaluation (Signed)
Anesthesia Evaluation  Patient identified by MRN, date of birth, ID band Patient awake    Reviewed: Allergy & Precautions, NPO status , Patient's Chart, lab work & pertinent test results  Airway Mallampati: II  TM Distance: >3 FB Neck ROM: Full    Dental no notable dental hx.    Pulmonary neg pulmonary ROS,    Pulmonary exam normal breath sounds clear to auscultation       Cardiovascular Normal cardiovascular exam+ dysrhythmias Atrial Fibrillation + Valvular Problems/Murmurs (bicuspid aortic valve)  Rhythm:Regular Rate:Normal     Neuro/Psych negative neurological ROS  negative psych ROS   GI/Hepatic Neg liver ROS, GERD  Medicated and Controlled,  Endo/Other  negative endocrine ROS  Renal/GU negative Renal ROS  negative genitourinary   Musculoskeletal  (+) Arthritis ,   Abdominal   Peds  Hematology negative hematology ROS (+)   Anesthesia Other Findings Left ureteral stone  Reproductive/Obstetrics                             Anesthesia Physical Anesthesia Plan  ASA: III  Anesthesia Plan: General   Post-op Pain Management:    Induction: Intravenous  PONV Risk Score and Plan: 2 and Ondansetron, Dexamethasone and Midazolam  Airway Management Planned: LMA  Additional Equipment:   Intra-op Plan:   Post-operative Plan: Extubation in OR  Informed Consent: I have reviewed the patients History and Physical, chart, labs and discussed the procedure including the risks, benefits and alternatives for the proposed anesthesia with the patient or authorized representative who has indicated his/her understanding and acceptance.     Dental advisory given  Plan Discussed with: CRNA  Anesthesia Plan Comments:         Anesthesia Quick Evaluation

## 2020-11-28 ENCOUNTER — Encounter (HOSPITAL_BASED_OUTPATIENT_CLINIC_OR_DEPARTMENT_OTHER): Payer: Self-pay | Admitting: Urology

## 2021-08-29 ENCOUNTER — Ambulatory Visit: Payer: Medicare Other | Admitting: Dermatology

## 2022-01-02 ENCOUNTER — Ambulatory Visit: Payer: Medicare Other | Admitting: Dermatology

## 2022-01-02 ENCOUNTER — Other Ambulatory Visit: Payer: Self-pay

## 2022-01-02 DIAGNOSIS — L820 Inflamed seborrheic keratosis: Secondary | ICD-10-CM | POA: Diagnosis not present

## 2022-01-02 DIAGNOSIS — D485 Neoplasm of uncertain behavior of skin: Secondary | ICD-10-CM

## 2022-01-02 NOTE — Progress Notes (Signed)
   Follow-Up Visit   Subjective  Jeremy Webb is a 66 y.o. male who presents for the following: Skin Problem (Pt here to have 3 moles evaluated. Pt states that they are embarrassing . Pt is having a procedure next wk and wants to take care of it before then ).  3 spots on lower abdomen have grown  The following portions of the chart were reviewed this encounter and updated as appropriate:        Objective  Well appearing patient in no apparent distress; mood and affect are within normal limits.  Lower abdomen, pubic area, genitalia examined.  Left Suprapubic Area     Left Suprapubic Area     Left Suprapubic Area      Assessment & Plan  Neoplasm of uncertain behavior of skin (3) Left Suprapubic Area  Skin / nail biopsy Type of biopsy: tangential   Informed consent: discussed and consent obtained   Timeout: patient name, date of birth, surgical site, and procedure verified   Anesthesia: the lesion was anesthetized in a standard fashion   Anesthetic:  1% lidocaine w/ epinephrine 1-100,000 local infiltration Instrument used: flexible razor blade   Hemostasis achieved with: ferric subsulfate and electrodesiccation   Outcome: patient tolerated procedure well   Post-procedure details: wound care instructions given    Left Suprapubic Area  Skin / nail biopsy Type of biopsy: tangential   Informed consent: discussed and consent obtained   Timeout: patient name, date of birth, surgical site, and procedure verified   Anesthesia: the lesion was anesthetized in a standard fashion   Anesthetic:  1% lidocaine w/ epinephrine 1-100,000 local infiltration Instrument used: flexible razor blade   Hemostasis achieved with: ferric subsulfate and electrodesiccation   Outcome: patient tolerated procedure well   Post-procedure details: wound care instructions given    Left Suprapubic Area  Skin / nail biopsy Type of biopsy: tangential   Informed consent: discussed and  consent obtained   Timeout: patient name, date of birth, surgical site, and procedure verified   Anesthesia: the lesion was anesthetized in a standard fashion   Anesthetic:  1% lidocaine w/ epinephrine 1-100,000 local infiltration Instrument used: flexible razor blade   Hemostasis achieved with: ferric subsulfate and electrodesiccation   Outcome: patient tolerated procedure well   Post-procedure details: wound care instructions given     No follow-ups on file.

## 2022-01-02 NOTE — Patient Instructions (Signed)

## 2022-01-07 ENCOUNTER — Telehealth: Payer: Self-pay

## 2022-01-07 NOTE — Telephone Encounter (Signed)
-----   Message from Lavonna Monarch, MD sent at 01/04/2022  7:08 AM EST ----- Can let patient know the microscopic favors benign keratoses rather than viral warts.  No further treatment indicated.

## 2022-01-07 NOTE — Telephone Encounter (Signed)
Phone call to patient with his pathology results. Patient aware of results.  

## 2022-01-26 ENCOUNTER — Encounter: Payer: Self-pay | Admitting: Dermatology

## 2022-07-17 NOTE — Progress Notes (Signed)
I, Lavonna Monarch, MD, have reviewed all documentation for this visit. The documentation on 07/17/22 for the exam, diagnosis, procedures, and orders are all accurate and complete.

## 2024-01-05 ENCOUNTER — Other Ambulatory Visit: Payer: Self-pay | Admitting: Urology

## 2024-01-12 NOTE — Progress Notes (Signed)
 Called pt regarding upcoming ESWL procedure. Per pt, passed stone Sunday 01/11/24 and has notified [facility].

## 2024-01-16 ENCOUNTER — Ambulatory Visit (HOSPITAL_BASED_OUTPATIENT_CLINIC_OR_DEPARTMENT_OTHER): Admission: RE | Admit: 2024-01-16 | Payer: Medicare Other | Source: Ambulatory Visit | Admitting: Urology

## 2024-01-16 ENCOUNTER — Encounter (HOSPITAL_BASED_OUTPATIENT_CLINIC_OR_DEPARTMENT_OTHER): Admission: RE | Payer: Self-pay | Source: Ambulatory Visit

## 2024-01-16 SURGERY — LITHOTRIPSY, ESWL
Anesthesia: LOCAL | Laterality: Right
